# Patient Record
Sex: Female | Born: 1950 | Race: White | Hispanic: No | Smoking: Former smoker
Health system: Southern US, Community
[De-identification: ages and names within clinical notes are randomized; demographics above are authoritative.]

## PROBLEM LIST (undated history)

## (undated) DIAGNOSIS — K529 Noninfective gastroenteritis and colitis, unspecified: Secondary | ICD-10-CM

## (undated) DIAGNOSIS — T148XXA Other injury of unspecified body region, initial encounter: Secondary | ICD-10-CM

## (undated) DIAGNOSIS — B059 Measles without complication: Secondary | ICD-10-CM

## (undated) DIAGNOSIS — I10 Essential (primary) hypertension: Principal | ICD-10-CM

## (undated) DIAGNOSIS — B019 Varicella without complication: Secondary | ICD-10-CM

## (undated) DIAGNOSIS — D1620 Benign neoplasm of long bones of unspecified lower limb: Secondary | ICD-10-CM

## (undated) DIAGNOSIS — Z87891 Personal history of nicotine dependence: Secondary | ICD-10-CM

## (undated) DIAGNOSIS — M545 Low back pain, unspecified: Secondary | ICD-10-CM

## (undated) DIAGNOSIS — G8929 Other chronic pain: Secondary | ICD-10-CM

## (undated) DIAGNOSIS — Z8619 Personal history of other infectious and parasitic diseases: Secondary | ICD-10-CM

## (undated) DIAGNOSIS — R296 Repeated falls: Secondary | ICD-10-CM

## (undated) DIAGNOSIS — R109 Unspecified abdominal pain: Secondary | ICD-10-CM

## (undated) DIAGNOSIS — E7800 Pure hypercholesterolemia, unspecified: Secondary | ICD-10-CM

## (undated) DIAGNOSIS — R7989 Other specified abnormal findings of blood chemistry: Secondary | ICD-10-CM

## (undated) HISTORY — DX: Low back pain, unspecified: M54.50

## (undated) HISTORY — PX: OTHER SURGICAL HISTORY: SHX169

## (undated) HISTORY — DX: Morbid (severe) obesity due to excess calories: E66.01

## (undated) HISTORY — DX: Other chronic pain: G89.29

## (undated) HISTORY — PX: APPENDECTOMY: SHX54

## (undated) HISTORY — DX: Other injury of unspecified body region, initial encounter: T14.8XXA

## (undated) HISTORY — DX: Essential (primary) hypertension: I10

## (undated) HISTORY — PX: CATARACT EXTRACTION: SUR2

## (undated) HISTORY — DX: Unspecified abdominal pain: R10.9

## (undated) HISTORY — DX: Benign neoplasm of long bones of unspecified lower limb: D16.20

## (undated) HISTORY — DX: Personal history of other infectious and parasitic diseases: Z86.19

## (undated) HISTORY — DX: Measles without complication: B05.9

## (undated) HISTORY — DX: Personal history of nicotine dependence: Z87.891

## (undated) HISTORY — DX: Pure hypercholesterolemia, unspecified: E78.00

## (undated) HISTORY — DX: Other specified abnormal findings of blood chemistry: R79.89

## (undated) HISTORY — DX: Varicella without complication: B01.9

## (undated) HISTORY — DX: Noninfective gastroenteritis and colitis, unspecified: K52.9

## (undated) HISTORY — DX: Low back pain: M54.5

---

## 2017-06-05 HISTORY — PX: MOHS SURGERY: SUR867

## 2018-02-02 LAB — BASIC METABOLIC PANEL
BUN: 16 (ref 4–21)
Creatinine: 0.8 (ref 0.5–1.1)
Glucose: 102
Potassium: 3.8 (ref 3.4–5.3)
SODIUM: 141 (ref 137–147)

## 2018-02-02 LAB — CBC AND DIFFERENTIAL
HEMATOCRIT: 42 (ref 36–46)
HEMOGLOBIN: 13.9 (ref 12.0–16.0)
Platelets: 315 (ref 150–399)
WBC: 8.9

## 2018-02-02 LAB — LIPID PANEL
CHOLESTEROL: 186 (ref 0–200)
HDL: 35 (ref 35–70)
LDL Cholesterol: 124
Triglycerides: 152 (ref 40–160)

## 2018-02-02 LAB — VITAMIN D 25 HYDROXY (VIT D DEFICIENCY, FRACTURES): VIT D 25 HYDROXY: 43

## 2018-02-02 LAB — TSH: TSH: 1.23 (ref 0.41–5.90)

## 2018-02-02 LAB — HEPATIC FUNCTION PANEL
ALT: 13 (ref 7–35)
AST: 17 (ref 13–35)
Alkaline Phosphatase: 75 (ref 25–125)

## 2018-02-02 LAB — HM HEPATITIS C SCREENING LAB: HM Hepatitis Screen: NEGATIVE

## 2018-08-01 NOTE — Progress Notes (Signed)
Sonya Woods is a 66 y.o. female is here to establish care.  History of Present Illness:   HPI: See Assessment and Plan section for Problem Based Charting of issues discussed today.   Health Maintenance Due  Topic Date Due  . Hepatitis C Screening  02-11-1951  . MAMMOGRAM  05/24/2000  . COLONOSCOPY  05/24/2000  . DEXA SCAN  05/25/2015  . PNA vac Low Risk Adult (1 of 2 - PCV13) 05/25/2015   Depression screen PHQ 2/9 08/02/2018  Decreased Interest 0  Down, Depressed, Hopeless 0  PHQ - 2 Score 0   PMHx, SurgHx, SocialHx, FamHx, Medications, and Allergies were reviewed in the Visit Navigator and updated as appropriate.   Patient Active Problem List   Diagnosis Date Noted  . Essential hypertension 08/03/2018  . Pure hypercholesterolemia 08/03/2018  . Morbid obesity ([place]) 08/03/2018  . Mammogram declined 08/03/2018  . Chronic lower back pain    Social History   Tobacco Use  . Smoking status: Former Smoker    Types: Cigarettes    Last attempt to quit: 08/03/2011    Years since quitting: 7.0  . Smokeless tobacco: Never Used  Substance Use Topics  . Alcohol use: Never    Frequency: Never  . Drug use: Never   Current Medications and Allergies:   .  aspirin 81 MG chewable tablet, Chew by mouth daily., Disp: , Rfl:  .  B Complex-C (B-COMPLEX WITH VITAMIN C) tablet, Take 1 tablet by mouth daily., Disp: , Rfl:  .  cholecalciferol (VITAMIN D) 1000 units tablet, Take 2,000 Units by mouth daily., Disp: , Rfl:  .  Coenzyme Q10 (CO Q 10 PO), Take 200 mg by mouth., Disp: , Rfl:  .  hydrochlorothiazide (MICROZIDE) 12.5 MG capsule, Take 1 capsule (12.5 mg total) by mouth daily., Disp: 90 capsule, Rfl: 1 .  rosuvastatin (CRESTOR) 5 MG tablet, Take 1 tablet (5 mg total) by mouth daily., Disp: 90 tablet, Rfl: 1 .  amLODipine (NORVASC) 10 MG tablet, Take 1 tablet (10 mg total) by mouth daily., Disp: 90 tablet, Rfl: 1   Allergies  Allergen Reactions  . Tetanus Toxoids   . Tramadol  Other (See Comments)   Review of Systems   Pertinent items are noted in the HPI. Otherwise, ROS is negative.  Vitals:   Vitals:   08/02/18 1101  BP: 136/64  Pulse: 64  Temp: 98.1 F (36.7 C)  TempSrc: Oral  SpO2: 98%  Weight: 225 lb (102.1 kg)  Height: 5' 5.5" (1.664 m)     Body mass index is 36.87 kg/m.  Physical Exam:   General: Cooperative, alert and oriented, well developed, well nourished, in no acute distress. HEENT: EOMI. Conjunctivae and lids unremarkable, funduscopic exam and visual fields not performed. No pallor or cyanosis, dentition good. Neck: No thyromegaly. No JVD. No carotid bruits.  Cardiovascular: Regular rhythm. S1 normal. S2 normal. No S3 or S4. Apical impulse not displaced. No murmurs. No gallops. No rubs. Lungs: Clear bilaterally without rales, rhonchi, or wheezing.  Abdomen: Soft, nontender, no masses or hepatosplenomegaly. Extremities: No clubbing, cyanosis, erythema. No edema.  Pulses: 2+ radial, 2+ pedal pulses. Skin: Reveals no rashes.  Neurologic: Cranial nerves are intact with no focal deficits.  Psychiatric: Alert and oriented to person place and time.  Assessment and Plan:   Essential hypertension Controlled on current regimen. No side effects. Will continue and recheck in 4 months at physical.   Morbid obesity [facility]) The patient is asked to make an  attempt to improve diet and exercise patterns to aid in medical management of this problem.   Pure hypercholesterolemia Controlled on current regimen. No side effects. Will continue and recheck in 4 months at physical.   Mammogram declined We discussed the risks and benefits of mammograms. Discussed different recommendations for routine screening in low risk women. Pt refuses at this time.   Meds ordered this encounter  Medications  . hydrochlorothiazide (MICROZIDE) 12.5 MG capsule    Sig: Take 1 capsule (12.5 mg total) by mouth daily.    Dispense:  90 capsule    Refill:  1  .  rosuvastatin (CRESTOR) 5 MG tablet    Sig: Take 1 tablet (5 mg total) by mouth daily.    Dispense:  90 tablet    Refill:  1  . amLODipine (NORVASC) 10 MG tablet    Sig: Take 1 tablet (10 mg total) by mouth daily.    Dispense:  90 tablet    Refill:  1   . Reviewed expectations re: course of current medical issues. . Discussed self-management of symptoms. . Outlined signs and symptoms indicating need for more acute intervention. . Patient verbalized understanding and all questions were answered. Marland Kitchen Health Maintenance issues including appropriate healthy diet, exercise, and smoking avoidance were discussed with patient. . See orders for this visit as documented in the electronic medical record. . Patient received an After Visit Summary.  Briscoe Deutscher, DO [place] 08/03/2018

## 2018-08-02 ENCOUNTER — Ambulatory Visit (INDEPENDENT_AMBULATORY_CARE_PROVIDER_SITE_OTHER): Payer: Medicare Other | Admitting: Family Medicine

## 2018-08-02 ENCOUNTER — Encounter: Payer: Self-pay | Admitting: Family Medicine

## 2018-08-02 VITALS — BP 136/64 | HR 64 | Temp 98.1°F | Ht 65.5 in | Wt 225.0 lb

## 2018-08-02 DIAGNOSIS — Z5320 Procedure and treatment not carried out because of patient's decision for unspecified reasons: Secondary | ICD-10-CM

## 2018-08-02 DIAGNOSIS — E7800 Pure hypercholesterolemia, unspecified: Secondary | ICD-10-CM

## 2018-08-02 DIAGNOSIS — I10 Essential (primary) hypertension: Secondary | ICD-10-CM

## 2018-08-02 DIAGNOSIS — M545 Low back pain, unspecified: Secondary | ICD-10-CM

## 2018-08-02 DIAGNOSIS — G8929 Other chronic pain: Secondary | ICD-10-CM

## 2018-08-02 MED ORDER — ROSUVASTATIN CALCIUM 5 MG PO TABS: 5.0000 mg | ORAL_TABLET | Freq: Every day | ORAL | 1 refills | Status: DC

## 2018-08-02 MED ORDER — AMLODIPINE BESYLATE 10 MG PO TABS: 10.0000 mg | ORAL_TABLET | Freq: Every day | ORAL | 1 refills | Status: DC

## 2018-08-02 MED ORDER — HYDROCHLOROTHIAZIDE 12.5 MG PO CAPS: 12.5000 mg | ORAL_CAPSULE | Freq: Every day | ORAL | 1 refills | Status: DC

## 2018-08-03 ENCOUNTER — Encounter: Payer: Self-pay | Admitting: Family Medicine

## 2018-08-03 DIAGNOSIS — E782 Mixed hyperlipidemia: Secondary | ICD-10-CM | POA: Insufficient documentation

## 2018-08-03 DIAGNOSIS — E7800 Pure hypercholesterolemia, unspecified: Secondary | ICD-10-CM

## 2018-08-03 DIAGNOSIS — I10 Essential (primary) hypertension: Secondary | ICD-10-CM | POA: Insufficient documentation

## 2018-08-03 DIAGNOSIS — E7219 Other disorders of sulfur-bearing amino-acid metabolism: Secondary | ICD-10-CM

## 2018-08-03 DIAGNOSIS — Z5320 Procedure and treatment not carried out because of patient's decision for unspecified reasons: Secondary | ICD-10-CM | POA: Insufficient documentation

## 2018-08-03 DIAGNOSIS — R7983 Abnormal findings of blood amino-acid level: Secondary | ICD-10-CM

## 2018-08-03 DIAGNOSIS — D688 Other specified coagulation defects: Secondary | ICD-10-CM | POA: Insufficient documentation

## 2018-08-03 DIAGNOSIS — M545 Low back pain, unspecified: Secondary | ICD-10-CM | POA: Insufficient documentation

## 2018-08-03 DIAGNOSIS — R7303 Prediabetes: Secondary | ICD-10-CM

## 2018-08-03 DIAGNOSIS — E8881 Metabolic syndrome: Secondary | ICD-10-CM | POA: Insufficient documentation

## 2018-08-03 DIAGNOSIS — G8929 Other chronic pain: Secondary | ICD-10-CM | POA: Insufficient documentation

## 2018-08-03 DIAGNOSIS — E785 Hyperlipidemia, unspecified: Secondary | ICD-10-CM | POA: Insufficient documentation

## 2018-08-03 HISTORY — DX: Essential (primary) hypertension: I10

## 2018-08-03 HISTORY — DX: Prediabetes: R73.03

## 2018-08-03 HISTORY — DX: Pure hypercholesterolemia, unspecified: E78.00

## 2018-08-03 HISTORY — DX: Mixed hyperlipidemia: E78.2

## 2018-08-03 HISTORY — DX: Abnormal findings of blood amino-acid level: R79.83

## 2018-08-03 HISTORY — DX: Other specified coagulation defects: D68.8

## 2018-08-03 HISTORY — DX: Morbid (severe) obesity due to excess calories: E66.01

## 2018-08-03 NOTE — Assessment & Plan Note (Signed)
We discussed the risks and benefits of mammograms. Discussed different recommendations for routine screening in low risk women. Pt refuses at this time.

## 2018-08-03 NOTE — Assessment & Plan Note (Addendum)
Controlled on current regimen. No side effects. Will continue and recheck in 4 months at physical.

## 2018-08-03 NOTE — Assessment & Plan Note (Signed)
Controlled on current regimen. No side effects. Will continue and recheck in 4 months at physical.

## 2018-08-03 NOTE — Assessment & Plan Note (Signed)
The patient is asked to make an attempt to improve diet and exercise patterns to aid in medical management of this problem.  

## 2019-01-12 ENCOUNTER — Telehealth: Payer: Self-pay

## 2019-01-12 NOTE — Telephone Encounter (Signed)
Called patient to make AWV app. She has flip phone and old computer with not webcam or speakers. We have made note of that and will call after everything clears up. She does need f/u to get meds have checked and insurance will do telephone visit. Do you want me to make phone visit for med f/u she will need next week.

## 2019-01-14 NOTE — Telephone Encounter (Signed)
Okay telephone visit for follow up.

## 2019-01-16 ENCOUNTER — Ambulatory Visit (INDEPENDENT_AMBULATORY_CARE_PROVIDER_SITE_OTHER): Payer: Medicare Other | Admitting: Family Medicine

## 2019-01-16 ENCOUNTER — Encounter: Payer: Self-pay | Admitting: Family Medicine

## 2019-01-16 ENCOUNTER — Other Ambulatory Visit: Payer: Self-pay

## 2019-01-16 VITALS — Ht 65.5 in | Wt 225.0 lb

## 2019-01-16 DIAGNOSIS — Z5320 Procedure and treatment not carried out because of patient's decision for unspecified reasons: Secondary | ICD-10-CM

## 2019-01-16 DIAGNOSIS — R7983 Abnormal findings of blood amino-acid level: Secondary | ICD-10-CM

## 2019-01-16 DIAGNOSIS — Z9189 Other specified personal risk factors, not elsewhere classified: Secondary | ICD-10-CM

## 2019-01-16 DIAGNOSIS — E7800 Pure hypercholesterolemia, unspecified: Secondary | ICD-10-CM

## 2019-01-16 DIAGNOSIS — I10 Essential (primary) hypertension: Secondary | ICD-10-CM

## 2019-01-16 DIAGNOSIS — E7219 Other disorders of sulfur-bearing amino-acid metabolism: Secondary | ICD-10-CM

## 2019-01-16 DIAGNOSIS — E8881 Metabolic syndrome: Secondary | ICD-10-CM

## 2019-01-16 HISTORY — DX: Other specified personal risk factors, not elsewhere classified: Z91.89

## 2019-01-16 MED ORDER — ROSUVASTATIN CALCIUM 5 MG PO TABS: 5.0000 mg | ORAL_TABLET | Freq: Every day | ORAL | 1 refills | Status: DC

## 2019-01-16 MED ORDER — AMLODIPINE BESYLATE 10 MG PO TABS: 10.0000 mg | ORAL_TABLET | Freq: Every day | ORAL | 1 refills | Status: DC

## 2019-01-16 NOTE — Progress Notes (Signed)
Virtual Visit via Video   I connected with Sonya Woods on 01/16/19 at 10:40 AM EDT by a video enabled telemedicine application and verified that I am speaking with the correct person using two identifiers. Location patient: Home Location provider: [facility], Office Persons participating in the virtual visit: Sonya Woods, [place], DO Sonya Woods, CMA acting as scribe for Dr. Briscoe Woods.   I discussed the limitations of evaluation and management by telemedicine and the availability of in person appointments. The patient expressed understanding and agreed to proceed.  Subjective:   HPI: Patient needs refill on the Norvasc and hydrochlorothiazide. She has not had any issues or side effects from medications. She has not been able to check her blood pressure. She will pick up a home cuff soon and call with her readings.   Is the patient taking medications without problems? Yes. Does the patient complain of muscle aches?  No. Trying to exercise on a regular basis? Yes. Compliant with diet? Yes.  Lab Results  Component Value Date   CHOL 186 02/02/2018   HDL 35 02/02/2018   LDLCALC 124 02/02/2018   TRIG 152 02/02/2018   Lab Results  Component Value Date   ALT 13 02/02/2018   AST 17 02/02/2018   ALKPHOS 75 02/02/2018     Reviewed all precautions and expectations with prevention of Covid-19. She works from home.   ROS: See pertinent positives and negatives per HPI.  Patient Active Problem List   Diagnosis Date Noted  . Cardiovascular risk factor > 12% 01/16/2019  . Essential hypertension 08/03/2018  . Pure hypercholesterolemia 08/03/2018  . Morbid obesity ([place]) 08/03/2018  . Mammogram declined 08/03/2018  . Metabolic syndrome 56/43/3295  . 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 Chronic lower back pain     Social History   Tobacco Use  . Smoking status: Former Smoker    Types: Cigarettes    Last attempt to quit:  08/03/2011    Years since quitting: 7.4  . Smokeless tobacco: Never Used  Substance Use Topics  . Alcohol use: Never    Frequency: Never   Current Outpatient Medications:  .  amLODipine (NORVASC) 10 MG tablet, Take 1 tablet (10 mg total) by mouth daily., Disp: 90 tablet, Rfl: 1 .  aspirin 81 MG chewable tablet, Chew by mouth daily., Disp: , Rfl:  .  B Complex-C (B-COMPLEX WITH VITAMIN C) tablet, Take 1 tablet by mouth daily., Disp: , Rfl:  .  cholecalciferol (VITAMIN D) 1000 units tablet, Take 2,000 Units by mouth daily., Disp: , Rfl:  .  Coenzyme Q10 (CO Q 10 PO), Take 200 mg by mouth., Disp: , Rfl:  .  hydrochlorothiazide (MICROZIDE) 12.5 MG capsule, Take 1 capsule (12.5 mg total) by mouth daily., Disp: 90 capsule, Rfl: 1 .  rosuvastatin (CRESTOR) 5 MG tablet, Take 1 tablet (5 mg total) by mouth daily., Disp: 90 tablet, Rfl: 1  Allergies  Allergen Reactions  . Losartan Potassium-Hctz   . Tetanus Toxoids   . Tramadol Other (See Comments)    Objective:   VITALS: Per patient if applicable, see vitals. GENERAL: Alert, appears well and in no acute distress. HEENT: Atraumatic, conjunctiva clear, no obvious abnormalities on inspection of external nose and ears. NECK: Normal movements of the head and neck. CARDIOPULMONARY: No increased WOB. Speaking in clear sentences. I:E ratio WNL.  MS: Moves all visible extremities without noticeable abnormality. PSYCH: Pleasant and cooperative, well-groomed. Speech normal rate and rhythm. Affect is  appropriate. Insight and judgement are appropriate. Attention is focused, linear, and appropriate.  NEURO: CN grossly intact. Oriented as arrived to appointment on time with no prompting. Moves both UE equally.  SKIN: No obvious lesions, wounds, erythema, or cyanosis noted on face or hands.  Assessment and Plan:   Sonya Woods was seen today for medication refill.  Diagnoses and all orders for this visit:  Pure hypercholesterolemia Comments: Check labs at  next visit. May increase Crestor to 10. Orders: -     rosuvastatin (CRESTOR) 5 MG tablet; Take 1 tablet (5 mg total) by mouth daily.  Metabolic syndrome  Morbid obesity ([place]) Comments: Discussed diet and exercise.   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  Mammogram declined  Essential hypertension Comments: Controlled. Continue current treatment. Orders: -     amLODipine (NORVASC) 10 MG tablet; Take 1 tablet (10 mg total) by mouth daily.   . Reviewed expectations re: course of current medical issues. . Discussed self-management of symptoms. . Outlined signs and symptoms indicating need for more acute intervention. . Patient verbalized understanding and all questions were answered. Marland Kitchen Health Maintenance issues including appropriate healthy diet, exercise, and smoking avoidance were discussed with patient. . See orders for this visit as documented in the electronic medical record.  Sonya Deutscher, DO 01/16/2019

## 2019-01-17 ENCOUNTER — Other Ambulatory Visit: Payer: Self-pay | Admitting: Family Medicine

## 2019-01-17 MED ORDER — HYDROCHLOROTHIAZIDE 12.5 MG PO CAPS: 12.5000 mg | ORAL_CAPSULE | Freq: Every day | ORAL | 1 refills | Status: DC

## 2019-01-17 NOTE — Telephone Encounter (Signed)
Requested Prescriptions  Pending Prescriptions Disp Refills  . hydrochlorothiazide (MICROZIDE) 12.5 MG capsule 90 capsule 1    Sig: Take 1 capsule (12.5 mg total) by mouth daily.     Cardiovascular: Diuretics - Thiazide Failed - 01/17/2019  9:46 AM      Failed - Ca in normal range and within 360 days    No results found for: CALCIUM, CORRECTEDCA, CAWHOLEBLD, POCCA       Passed - Cr in normal range and within 360 days    Creatinine  Date Value Ref Range Status  02/02/2018 0.8 0.5 - 1.1 Final         Passed - K in normal range and within 360 days    Potassium  Date Value Ref Range Status  02/02/2018 3.8 3.4 - 5.3 Final         Passed - Na in normal range and within 360 days    Sodium  Date Value Ref Range Status  02/02/2018 141 137 - 147 Final         Passed - Last BP in normal range    BP Readings from Last 1 Encounters:  08/02/18 136/64         Passed - Valid encounter within last 6 months    Recent Outpatient Visits          Yesterday Pure hypercholesterolemia   [place] [facility], [place], [place]   5 months ago Essential hypertension   [place], [place], [place]

## 2019-07-19 ENCOUNTER — Other Ambulatory Visit: Payer: Self-pay

## 2019-07-19 DIAGNOSIS — I10 Essential (primary) hypertension: Secondary | ICD-10-CM

## 2019-07-19 DIAGNOSIS — E7800 Pure hypercholesterolemia, unspecified: Secondary | ICD-10-CM

## 2019-07-19 MED ORDER — AMLODIPINE BESYLATE 10 MG PO TABS: 10.0000 mg | ORAL_TABLET | Freq: Every day | ORAL | 1 refills | Status: DC

## 2019-07-19 MED ORDER — HYDROCHLOROTHIAZIDE 12.5 MG PO CAPS: 12.5000 mg | ORAL_CAPSULE | Freq: Every day | ORAL | 1 refills | Status: DC

## 2019-07-19 MED ORDER — ROSUVASTATIN CALCIUM 5 MG PO TABS: 5.0000 mg | ORAL_TABLET | Freq: Every day | ORAL | 1 refills | Status: DC

## 2019-07-28 ENCOUNTER — Other Ambulatory Visit: Payer: Self-pay

## 2019-07-28 DIAGNOSIS — E7800 Pure hypercholesterolemia, unspecified: Secondary | ICD-10-CM

## 2019-07-28 DIAGNOSIS — I10 Essential (primary) hypertension: Secondary | ICD-10-CM

## 2019-07-28 MED ORDER — ROSUVASTATIN CALCIUM 5 MG PO TABS: 5.0000 mg | ORAL_TABLET | Freq: Every day | ORAL | 1 refills | Status: DC

## 2019-07-28 MED ORDER — HYDROCHLOROTHIAZIDE 12.5 MG PO CAPS: 12.5000 mg | ORAL_CAPSULE | Freq: Every day | ORAL | 1 refills | Status: DC

## 2019-07-28 MED ORDER — AMLODIPINE BESYLATE 10 MG PO TABS: 10.0000 mg | ORAL_TABLET | Freq: Every day | ORAL | 1 refills | Status: DC

## 2019-09-07 ENCOUNTER — Other Ambulatory Visit: Payer: Self-pay | Admitting: Family Medicine

## 2019-09-07 DIAGNOSIS — Z1231 Encounter for screening mammogram for malignant neoplasm of breast: Secondary | ICD-10-CM

## 2020-02-08 ENCOUNTER — Other Ambulatory Visit: Payer: Self-pay

## 2020-02-08 ENCOUNTER — Encounter: Payer: Self-pay | Admitting: Family Medicine

## 2020-02-08 ENCOUNTER — Ambulatory Visit (INDEPENDENT_AMBULATORY_CARE_PROVIDER_SITE_OTHER): Payer: Medicare Other | Admitting: Family Medicine

## 2020-02-08 VITALS — BP 160/80 | HR 77 | Temp 98.3°F | Ht 65.5 in | Wt 225.0 lb

## 2020-02-08 DIAGNOSIS — E8881 Metabolic syndrome: Secondary | ICD-10-CM

## 2020-02-08 DIAGNOSIS — I10 Essential (primary) hypertension: Secondary | ICD-10-CM | POA: Diagnosis not present

## 2020-02-08 DIAGNOSIS — E7800 Pure hypercholesterolemia, unspecified: Secondary | ICD-10-CM

## 2020-02-08 DIAGNOSIS — Z23 Encounter for immunization: Secondary | ICD-10-CM | POA: Diagnosis not present

## 2020-02-08 LAB — HEMOGLOBIN A1C: Hgb A1c MFr Bld: 5.7 % (ref 4.6–6.5)

## 2020-02-08 MED ORDER — HYDROCHLOROTHIAZIDE 25 MG PO TABS: 25.0000 mg | ORAL_TABLET | Freq: Every day | ORAL | 3 refills | Status: DC

## 2020-02-08 NOTE — Patient Instructions (Addendum)
pneumonia shot today. Will not need again. One and done.   If you want shingles vaccine, go to pharmacy. Much cheaper.   Increasing your hctz to 25mg /day. I sent in new px. Bring your cuff on next visit so we can make sure it's calibrated correctly.   i'll see you in one month!  So nice to meet you!   Dr. Rogers Blocker

## 2020-02-08 NOTE — Progress Notes (Signed)
Patient: Sonya Woods MRN: [id number] DOB: 04/06/1951 PCP: Orma Flaming, MD     Subjective:  Chief Complaint  Patient presents with  . Hypertension  . Hyperlipidemia  . metabolic syndrome  . Form Completion    Handicap parking    HPI: The patient is a 69 y.o. female who presents today for transfer of care. She has a past medical history significant for HTN, hyperlipidemia, obesity, metabolic syndrome, homocysteinemi and hyperfibrinogenemia.   Hypertension  Here for follow up of hypertension.  Currently on norvasc 10mg , hctz 12.5mg  . Home readings range from [id number] diastolic. Takes medication as prescribed and denies any side effects. Exercise includes sit ups, limited due to back. Weight has been stable. Denies any chest pain, headaches, shortness of breath, vision changes, swelling in lower extremities.    Hyperlipidemia Currently on crestor 5mg . +Fh of heart disease in her father. Died of MI at age 34 years of age. Former smoker, stopped in 2012. She has no hx of diabetes.   metabolic syndrome Weight stable Tries to exercise, but limited with her back pain.   Would like me to renew her handicap placard.    HM -declines colonoscopy and mmg.  -will get her pneumovax today -interested in shingles shot -has had her covid shots    Review of Systems  HENT: Negative for congestion and sore throat.   Respiratory: Negative for choking, shortness of breath and wheezing.   Cardiovascular: Negative for chest pain and palpitations.  Gastrointestinal: Negative for abdominal pain, nausea and vomiting.  Musculoskeletal: Positive for back pain (chronic ).  Neurological: Negative for dizziness, light-headedness and headaches.    Allergies Patient is allergic to losartan potassium-hctz; tetanus toxoids; and tramadol.  Past Medical History Patient  has a past medical history of Benign tumor of right femur, s/p removal, Chicken pox, Chronic lower back pain,  Essential hypertension (08/03/2018), Former smoker, 100 pack year Hx, quit < 10 years ago, Fracture, Low vitamin D level, Measles, Morbid obesity ([place]) (08/03/2018), and Pure hypercholesterolemia (08/03/2018).  Surgical History Patient  has a past surgical history that includes Cesarean section (1976); Appendectomy; Mohs surgery (06/2017); Cataract extraction (Right); and S/P Bone Tumor Surgery .  Family History Pateint's family history includes Dementia in her mother; Heart attack in her father; Hyperlipidemia in her father; Hypertension in her father; Mental illness in her mother.  Social History Patient  reports that she quit smoking about 8 years ago. Her smoking use included cigarettes. She has never used smokeless tobacco. She reports that she does not drink alcohol or use drugs.    Objective: Vitals:   02/08/20 1306 02/08/20 1340  BP: (!) 162/70 (!) 160/80  Pulse: 77   Temp: 98.3 F (36.8 C)   TempSrc: Temporal   SpO2: 98%   Weight: 225 lb (102.1 kg)   Height: 5' 5.5" (1.664 m)     Body mass index is 36.87 kg/m.  Physical Exam Vitals reviewed.  Constitutional:      Appearance: Normal appearance. She is well-developed. She is obese.  HENT:     Head: Normocephalic and atraumatic.     Right Ear: External ear normal.     Left Ear: External ear normal.  Eyes:     Conjunctiva/sclera: Conjunctivae normal.     Pupils: Pupils are equal, round, and reactive to light.  Neck:     Thyroid: No thyromegaly.     Vascular: No carotid bruit.  Cardiovascular:     Rate and Rhythm: Normal rate and regular rhythm.  Heart sounds: Normal heart sounds. No murmur.  Pulmonary:     Effort: Pulmonary effort is normal.     Breath sounds: Normal breath sounds.  Abdominal:     General: Bowel sounds are normal. There is no distension.     Palpations: Abdomen is soft.     Tenderness: There is no abdominal tenderness.  Musculoskeletal:     Cervical back: Normal range of motion and neck  supple.     Right lower leg: No edema.     Left lower leg: Edema present.  Lymphadenopathy:     Cervical: No cervical adenopathy.  Skin:    General: Skin is warm and dry.     Capillary Refill: Capillary refill takes less than 2 seconds.     Findings: No rash.  Neurological:     General: No focal deficit present.     Mental Status: She is alert and oriented to person, place, and time.     Cranial Nerves: No cranial nerve deficit.     Coordination: Coordination normal.     Deep Tendon Reflexes: Reflexes normal.  Psychiatric:        Mood and Affect: Mood normal.        Behavior: Behavior normal.          Office Visit from 02/08/2020 in [place]  PHQ-2 Total Score  0      Assessment/plan: 1. Essential hypertension Blood pressure is not to goal. Continue norvasc at 10mg  and we are going to increase her hctz to 25mg . New px sent in. She will f/u in 1 month with log and blood pressure cuff to make sure calibrated correctly.   Refills given and routine lab work will be done today. Recommended routine exercise and healthy diet including DASH diet and mediterranean diet. Encouraged weight loss. F/u in 1 months.    2. Pure hypercholesterolemia Lipid panel. May have to do on next visit. Do not think epic was working and lab was not seen. Continue crestor.   3. Metabolic syndrome  - Hemoglobin A1c  4. Need for vaccination for Strep pneumoniae  - Pneumococcal polysaccharide vaccine 23-valent greater than or equal to 2yo subcutaneous/IM  -handicap placard filled out.  -recommended shingles vaccine at pharmacy due to cheaper cost.   This visit occurred during the SARS-CoV-2 public health emergency.  Safety protocols were in place, including screening questions prior to the visit, additional usage of staff PPE, and extensive cleaning of exam room while observing appropriate contact time as indicated for disinfecting solutions.     Return in about 1 month  (around 03/10/2020) for blood pressure follow up .     Orma Flaming, MD [place]  02/08/2020

## 2020-02-12 ENCOUNTER — Encounter: Payer: Self-pay | Admitting: Family Medicine

## 2020-03-11 ENCOUNTER — Other Ambulatory Visit: Payer: Self-pay

## 2020-03-11 ENCOUNTER — Encounter: Payer: Self-pay | Admitting: Family Medicine

## 2020-03-11 ENCOUNTER — Ambulatory Visit (INDEPENDENT_AMBULATORY_CARE_PROVIDER_SITE_OTHER): Payer: Medicare Other | Admitting: Family Medicine

## 2020-03-11 DIAGNOSIS — I10 Essential (primary) hypertension: Secondary | ICD-10-CM

## 2020-03-11 DIAGNOSIS — E7800 Pure hypercholesterolemia, unspecified: Secondary | ICD-10-CM

## 2020-03-11 LAB — CBC WITH DIFFERENTIAL/PLATELET
Basophils Absolute: 0.1 10*3/uL (ref 0.0–0.1)
Basophils Relative: 0.7 % (ref 0.0–3.0)
Eosinophils Absolute: 0.3 10*3/uL (ref 0.0–0.7)
Eosinophils Relative: 3.1 % (ref 0.0–5.0)
HCT: 40.7 % (ref 36.0–46.0)
Hemoglobin: 13.5 g/dL (ref 12.0–15.0)
Lymphocytes Relative: 22.8 % (ref 12.0–46.0)
Lymphs Abs: 2.4 10*3/uL (ref 0.7–4.0)
MCHC: 33.1 g/dL (ref 30.0–36.0)
MCV: 85.3 fl (ref 78.0–100.0)
Monocytes Absolute: 0.8 10*3/uL (ref 0.1–1.0)
Monocytes Relative: 7.2 % (ref 3.0–12.0)
Neutro Abs: 7.1 10*3/uL (ref 1.4–7.7)
Neutrophils Relative %: 66.2 % (ref 43.0–77.0)
Platelets: 266 10*3/uL (ref 150.0–400.0)
RBC: 4.77 Mil/uL (ref 3.87–5.11)
RDW: 14.9 % (ref 11.5–15.5)
WBC: 10.7 10*3/uL — ABNORMAL HIGH (ref 4.0–10.5)

## 2020-03-11 LAB — COMPREHENSIVE METABOLIC PANEL
ALT: 12 U/L (ref 0–35)
AST: 17 U/L (ref 0–37)
Albumin: 4.2 g/dL (ref 3.5–5.2)
Alkaline Phosphatase: 58 U/L (ref 39–117)
BUN: 15 mg/dL (ref 6–23)
CO2: 32 mEq/L (ref 19–32)
Calcium: 9.6 mg/dL (ref 8.4–10.5)
Chloride: 101 mEq/L (ref 96–112)
Creatinine, Ser: 0.87 mg/dL (ref 0.40–1.20)
GFR: 64.41 mL/min (ref >60.00)
Glucose, Bld: 99 mg/dL (ref 70–99)
Potassium: 3.7 mEq/L (ref 3.5–5.1)
Sodium: 140 mEq/L (ref 135–145)
Total Bilirubin: 0.5 mg/dL (ref 0.2–1.2)
Total Protein: 7.2 g/dL (ref 6.0–8.3)

## 2020-03-11 LAB — LIPID PANEL
Cholesterol: 131 mg/dL (ref 0–200)
HDL: 40.5 mg/dL (ref >39.00)
LDL Cholesterol: 65 mg/dL (ref 0–99)
NonHDL: 90.87
Total CHOL/HDL Ratio: 3
Triglycerides: 127 mg/dL (ref 0.0–149.0)
VLDL: 25.4 mg/dL (ref 0.0–40.0)

## 2020-03-11 NOTE — Progress Notes (Signed)
Patient: Sonya Woods MRN: [id number] DOB: [birth year] PCP: Orma Flaming, MD     Subjective:  Chief Complaint  Patient presents with  . Hypertension    HPI: The patient is a 69 y.o. female who presents today for Hypertension follow up. She is also here for fasting labs.   Hypertension: Here for follow up of hypertension.  I increased her hctz to 25mg .  Currently on norvasc 10mg  and hctz 25mg  . Home readings range from 846 [id number]/17-79 diastolic. Takes medication as prescribed and denies any side effects. Exercise includes none. Weight has been stable. Denies any chest pain, headaches, shortness of breath, vision changes, swelling in lower extremities.    Review of Systems  Constitutional: Negative for chills, fatigue and fever.  Respiratory: Negative for shortness of breath and wheezing.   Cardiovascular: Negative for chest pain and palpitations.  Gastrointestinal: Negative for abdominal pain, nausea and vomiting.  Neurological: Negative for dizziness, light-headedness and headaches.    Allergies Patient is allergic to losartan potassium-hctz; tetanus toxoids; and tramadol.  Past Medical History Patient  has a past medical history of Benign tumor of right femur, s/p removal, Chicken pox, Chronic lower back pain, Essential hypertension (08/03/2018), Former smoker, 100 pack year Hx, quit < 10 years ago, Fracture, Low vitamin D level, Measles, Morbid obesity ([place]) (08/03/2018), and Pure hypercholesterolemia (08/03/2018).  Surgical History Patient  has a past surgical history that includes Cesarean section (1976); Appendectomy; Mohs surgery (06/2017); Cataract extraction (Right); and S/P Bone Tumor Surgery .  Family History Pateint's family history includes Dementia in her mother; Heart attack in her father; Hyperlipidemia in her father; Hypertension in her father; Mental illness in her mother.  Social History Patient  reports that she quit smoking about 8 years ago. Her  smoking use included cigarettes. She has never used smokeless tobacco. She reports that she does not drink alcohol or use drugs.    Objective: Vitals:   03/11/20 0851  BP: 134/74  Pulse: 66  Temp: (!) 97.2 F (36.2 C)  TempSrc: Temporal  SpO2: 98%  Height: 5' 5.5" (1.664 m)    Body mass index is 36.87 kg/m.  Physical Exam Vitals reviewed.  Constitutional:      Appearance: Normal appearance. She is obese.  HENT:     Head: Normocephalic and atraumatic.  Cardiovascular:     Rate and Rhythm: Normal rate and regular rhythm.     Heart sounds: Murmur present.  Pulmonary:     Effort: Pulmonary effort is normal.     Breath sounds: Normal breath sounds.  Abdominal:     General: Bowel sounds are normal.     Palpations: Abdomen is soft.  Musculoskeletal:     Cervical back: Normal range of motion and neck supple.  Neurological:     Mental Status: She is alert.  Psychiatric:        Mood and Affect: Mood normal.        Behavior: Behavior normal.        Assessment/plan: 1. Essential hypertension Blood pressure is to goal. Continue current anti-hypertensive medications per hpi. Tolerating increased hctz. Refills not given and routine lab work will be done today. Recommended routine exercise and healthy diet including DASH diet and mediterranean diet. Encouraged weight loss. F/u in 6 months.   - Microalbumin / creatinine urine ratio - Comprehensive metabolic panel - CBC with Differential/Platelet  2. Pure hypercholesterolemia  - Lipid panel  Fasting labs, declines ekg today   3, new murmur -sounds like a flow murmur.  She is asymptomatic. Will f/u in 6 months. Precautions given.   This visit occurred during the SARS-CoV-2 public health emergency.  Safety protocols were in place, including screening questions prior to the visit, additional usage of staff PPE, and extensive cleaning of exam room while observing appropriate contact time as indicated for disinfecting solutions.     Return in about 6 months (around 09/10/2020) for routine bp f/u and ekg .   Orma Flaming, MD [place]   03/11/2020

## 2020-04-04 ENCOUNTER — Ambulatory Visit: Payer: Medicare Other

## 2020-05-06 ENCOUNTER — Ambulatory Visit: Payer: Medicare Other

## 2020-07-03 ENCOUNTER — Other Ambulatory Visit: Payer: Self-pay | Admitting: Family Medicine

## 2020-07-05 ENCOUNTER — Other Ambulatory Visit: Payer: Self-pay | Admitting: Family Medicine

## 2020-07-05 DIAGNOSIS — I10 Essential (primary) hypertension: Secondary | ICD-10-CM

## 2020-07-09 ENCOUNTER — Telehealth: Payer: Self-pay

## 2020-07-09 DIAGNOSIS — E7800 Pure hypercholesterolemia, unspecified: Secondary | ICD-10-CM

## 2020-07-09 DIAGNOSIS — I10 Essential (primary) hypertension: Secondary | ICD-10-CM

## 2020-07-09 MED ORDER — ROSUVASTATIN CALCIUM 5 MG PO TABS: 5.0000 mg | ORAL_TABLET | Freq: Every day | ORAL | 1 refills | Status: DC

## 2020-07-09 MED ORDER — AMLODIPINE BESYLATE 10 MG PO TABS: 10.0000 mg | ORAL_TABLET | Freq: Every day | ORAL | 1 refills | Status: DC

## 2020-07-09 NOTE — Telephone Encounter (Signed)
Medication sent in. 

## 2020-07-09 NOTE — Telephone Encounter (Signed)
.. °  LAST APPOINTMENT DATE: 07/05/2020   NEXT APPOINTMENT DATE:@12 /05/2020  MEDICATION:amLODipine (NORVASC) 10 MG tablet rosuvastatin (CRESTOR) 5 MG tablet      Please advise

## 2020-09-09 ENCOUNTER — Telehealth: Payer: Self-pay

## 2020-09-09 NOTE — Telephone Encounter (Signed)
Patient states that since we doubled her hydrochlorothiazide (HYDRODIURIL) 25 MG tablet used to be 12 MG patient is expeiricng hair loss and dry skin all over body and expresses she cannot deal with it anymore and would like it either reduced or try a different medication.

## 2020-09-09 NOTE — Telephone Encounter (Signed)
Please have her cut her dose back down to 1/2 a pill a day. That is odd complaints as you dont' see hair loss typically in lower doses. If she continues to have hair loss unsure if medication. Also what is her allergy to losartan?   - call me in 2 weeks with blood pressure readings after decreasing pill. Thanks,  Dr. Rogers Blocker

## 2020-09-11 ENCOUNTER — Ambulatory Visit: Payer: Medicare Other | Admitting: Family Medicine

## 2020-09-11 NOTE — Telephone Encounter (Signed)
I gave pt message below, states when she took lower dose she had no problems, when she started double dose, she started to have skin reactions. She is unsure allergy to losartan. She agrees to follow up in 2 weeks with BP readings.

## 2020-10-09 ENCOUNTER — Telehealth: Payer: Self-pay | Admitting: Family Medicine

## 2020-10-09 NOTE — Telephone Encounter (Signed)
Left message for patient to call back and schedule Medicare Annual Wellness Visit (AWV) either virtually OR in office.   No hx; please schedule at anytime with [facility]-Nurse Health Advisor at [facility].  This should be a 45 minute visit.   

## 2021-01-04 ENCOUNTER — Other Ambulatory Visit: Payer: Self-pay | Admitting: Family Medicine

## 2021-01-04 DIAGNOSIS — E7800 Pure hypercholesterolemia, unspecified: Secondary | ICD-10-CM

## 2021-01-07 ENCOUNTER — Other Ambulatory Visit: Payer: Self-pay

## 2021-01-07 ENCOUNTER — Other Ambulatory Visit: Payer: Self-pay | Admitting: Family Medicine

## 2021-01-07 DIAGNOSIS — I10 Essential (primary) hypertension: Secondary | ICD-10-CM

## 2021-01-07 MED ORDER — AMLODIPINE BESYLATE 10 MG PO TABS: 10.0000 mg | ORAL_TABLET | Freq: Every day | ORAL | 0 refills | Status: DC

## 2021-01-27 ENCOUNTER — Encounter: Payer: Self-pay | Admitting: Podiatry

## 2021-01-27 ENCOUNTER — Other Ambulatory Visit: Payer: Self-pay

## 2021-01-27 ENCOUNTER — Ambulatory Visit: Payer: Medicare Other | Admitting: Podiatry

## 2021-01-27 DIAGNOSIS — B351 Tinea unguium: Secondary | ICD-10-CM

## 2021-01-27 DIAGNOSIS — M79674 Pain in right toe(s): Secondary | ICD-10-CM | POA: Insufficient documentation

## 2021-01-27 DIAGNOSIS — M79675 Pain in left toe(s): Secondary | ICD-10-CM

## 2021-01-27 HISTORY — DX: Tinea unguium: M79.674

## 2021-01-27 NOTE — Progress Notes (Signed)
This patient rpresents  to the office for evaluation and treatment of long thick painful nails .  This patient is unable to trim her own nails since the patient cannot reach her feet.  Patient says the nails are painful walking and wearing his shoes.  He returns for preventive foot care services.  General Appearance  Alert, conversant and in no acute stress.  Vascular  Dorsalis pedis and posterior tibial  pulses are palpable  bilaterally.  Capillary return is within normal limits  bilaterally. Temperature is within normal limits  bilaterally.  Neurologic  Senn-Weinstein monofilament wire test within normal limits  bilaterally. Muscle power within normal limits bilaterally.  Nails Thick disfigured discolored nails with subungual debris  from hallux to fifth toes bilaterally. No evidence of bacterial infection or drainage bilaterally.  Orthopedic  No limitations of motion  feet .  No crepitus or effusions noted.  No bony pathology or digital deformities noted.  Skin  normotropic skin with no porokeratosis noted bilaterally.  No signs of infections or ulcers noted.     Onychomycosis  Pain in toes right foot  Pain in toes left foot  Debridement  of nails  1-5  B/L with a nail nipper.  Nails were then filed using a dremel tool with no incidents.    RTC  3 months   Gardiner Barefoot DPM

## 2021-02-08 ENCOUNTER — Other Ambulatory Visit: Payer: Self-pay | Admitting: Family Medicine

## 2021-02-08 DIAGNOSIS — I10 Essential (primary) hypertension: Secondary | ICD-10-CM

## 2021-02-20 ENCOUNTER — Other Ambulatory Visit: Payer: Self-pay | Admitting: Family Medicine

## 2021-04-28 ENCOUNTER — Ambulatory Visit: Payer: Medicare Other | Admitting: Podiatry

## 2021-05-14 ENCOUNTER — Ambulatory Visit: Payer: Medicare Other | Admitting: Podiatry

## 2021-07-01 ENCOUNTER — Other Ambulatory Visit: Payer: Self-pay | Admitting: Family Medicine

## 2021-07-01 DIAGNOSIS — E7800 Pure hypercholesterolemia, unspecified: Secondary | ICD-10-CM

## 2021-07-07 ENCOUNTER — Other Ambulatory Visit: Payer: Self-pay | Admitting: Family Medicine

## 2021-07-07 ENCOUNTER — Telehealth: Payer: Self-pay

## 2021-07-07 DIAGNOSIS — E7800 Pure hypercholesterolemia, unspecified: Secondary | ICD-10-CM

## 2021-07-07 MED ORDER — ROSUVASTATIN CALCIUM 5 MG PO TABS: ORAL_TABLET | ORAL | 1 refills | Status: DC

## 2021-07-07 NOTE — Telephone Encounter (Signed)
Refill sent to pharmacy.   

## 2021-07-07 NOTE — Telephone Encounter (Signed)
LAST APPOINTMENT DATE:  03/11/20  NEXT APPOINTMENT DATE: 08/01/21   TOC with Colletta Maryland from [place]: rosuvastatin (CRESTOR) 5 MG tablet  PHARMACY: [place], [place] - [address] AT [place]

## 2021-07-24 ENCOUNTER — Observation Stay (HOSPITAL_COMMUNITY)
Admission: EM | Admit: 2021-07-24 | Discharge: 2021-07-27 | Disposition: A | Discharge status: Home / Self Care | Payer: Medicare Other | Attending: Internal Medicine | Admitting: Internal Medicine

## 2021-07-24 ENCOUNTER — Emergency Department (HOSPITAL_COMMUNITY): Payer: Medicare Other

## 2021-07-24 ENCOUNTER — Encounter (HOSPITAL_COMMUNITY): Payer: Self-pay

## 2021-07-24 ENCOUNTER — Other Ambulatory Visit: Payer: Self-pay

## 2021-07-24 ENCOUNTER — Telehealth: Payer: Self-pay

## 2021-07-24 DIAGNOSIS — R1030 Lower abdominal pain, unspecified: Secondary | ICD-10-CM | POA: Diagnosis not present

## 2021-07-24 DIAGNOSIS — E876 Hypokalemia: Secondary | ICD-10-CM | POA: Diagnosis not present

## 2021-07-24 DIAGNOSIS — K529 Noninfective gastroenteritis and colitis, unspecified: Secondary | ICD-10-CM

## 2021-07-24 DIAGNOSIS — Z79899 Other long term (current) drug therapy: Secondary | ICD-10-CM | POA: Diagnosis not present

## 2021-07-24 DIAGNOSIS — I771 Stricture of artery: Secondary | ICD-10-CM

## 2021-07-24 DIAGNOSIS — E782 Mixed hyperlipidemia: Secondary | ICD-10-CM | POA: Diagnosis present

## 2021-07-24 DIAGNOSIS — K5730 Diverticulosis of large intestine without perforation or abscess without bleeding: Secondary | ICD-10-CM | POA: Diagnosis not present

## 2021-07-24 DIAGNOSIS — K551 Chronic vascular disorders of intestine: Secondary | ICD-10-CM

## 2021-07-24 DIAGNOSIS — K55039 Acute (reversible) ischemia of large intestine, extent unspecified: Principal | ICD-10-CM | POA: Insufficient documentation

## 2021-07-24 DIAGNOSIS — I7410 Embolism and thrombosis of unspecified parts of aorta: Secondary | ICD-10-CM

## 2021-07-24 DIAGNOSIS — I10 Essential (primary) hypertension: Secondary | ICD-10-CM | POA: Insufficient documentation

## 2021-07-24 DIAGNOSIS — Z7982 Long term (current) use of aspirin: Secondary | ICD-10-CM | POA: Diagnosis not present

## 2021-07-24 DIAGNOSIS — R7303 Prediabetes: Secondary | ICD-10-CM | POA: Insufficient documentation

## 2021-07-24 DIAGNOSIS — K922 Gastrointestinal hemorrhage, unspecified: Secondary | ICD-10-CM | POA: Diagnosis not present

## 2021-07-24 DIAGNOSIS — I714 Abdominal aortic aneurysm, without rupture, unspecified: Secondary | ICD-10-CM | POA: Diagnosis not present

## 2021-07-24 DIAGNOSIS — K625 Hemorrhage of anus and rectum: Secondary | ICD-10-CM | POA: Diagnosis not present

## 2021-07-24 DIAGNOSIS — I708 Atherosclerosis of other arteries: Secondary | ICD-10-CM | POA: Diagnosis not present

## 2021-07-24 DIAGNOSIS — K6389 Other specified diseases of intestine: Secondary | ICD-10-CM | POA: Diagnosis not present

## 2021-07-24 DIAGNOSIS — R109 Unspecified abdominal pain: Secondary | ICD-10-CM | POA: Diagnosis not present

## 2021-07-24 DIAGNOSIS — I7143 Infrarenal abdominal aortic aneurysm, without rupture: Secondary | ICD-10-CM | POA: Diagnosis not present

## 2021-07-24 DIAGNOSIS — Z87891 Personal history of nicotine dependence: Secondary | ICD-10-CM | POA: Diagnosis not present

## 2021-07-24 DIAGNOSIS — Z20822 Contact with and (suspected) exposure to covid-19: Secondary | ICD-10-CM | POA: Insufficient documentation

## 2021-07-24 DIAGNOSIS — E7800 Pure hypercholesterolemia, unspecified: Secondary | ICD-10-CM | POA: Diagnosis present

## 2021-07-24 HISTORY — DX: Hypokalemia: E87.6

## 2021-07-24 LAB — ABO/RH: ABO/RH(D): O NEG

## 2021-07-24 LAB — URINALYSIS, ROUTINE W REFLEX MICROSCOPIC
Bilirubin Urine: NEGATIVE
Glucose, UA: NEGATIVE mg/dL
Ketones, ur: 5 mg/dL — AB
Leukocytes,Ua: NEGATIVE
Nitrite: NEGATIVE
Protein, ur: NEGATIVE mg/dL
Specific Gravity, Urine: 1.017 (ref 1.005–1.030)
pH: 6 (ref 5.0–8.0)

## 2021-07-24 LAB — LACTIC ACID, PLASMA
Lactic Acid, Venous: 1.5 mmol/L (ref 0.5–1.9)
Lactic Acid, Venous: 1.2 mmol/L (ref 0.5–1.9)

## 2021-07-24 LAB — COMPREHENSIVE METABOLIC PANEL
ALT: 17 U/L (ref 0–44)
AST: 23 U/L (ref 15–41)
Albumin: 4.4 g/dL (ref 3.5–5.0)
Alkaline Phosphatase: 60 U/L (ref 38–126)
Anion gap: 11 (ref 5–15)
BUN: 18 mg/dL (ref 8–23)
CO2: 26 mmol/L (ref 22–32)
Calcium: 9.4 mg/dL (ref 8.9–10.3)
Chloride: 100 mmol/L (ref 98–111)
Creatinine, Ser: 0.9 mg/dL (ref 0.44–1.00)
GFR, Estimated: >60 mL/min (ref >60)
Glucose, Bld: 114 mg/dL — ABNORMAL HIGH (ref 70–99)
Potassium: 3.1 mmol/L — ABNORMAL LOW (ref 3.5–5.1)
Sodium: 137 mmol/L (ref 135–145)
Total Bilirubin: 0.7 mg/dL (ref 0.3–1.2)
Total Protein: 8.4 g/dL — ABNORMAL HIGH (ref 6.5–8.1)

## 2021-07-24 LAB — CBC
HCT: 43.9 % (ref 36.0–46.0)
HCT: 44.3 % (ref 36.0–46.0)
Hemoglobin: 14.5 g/dL (ref 12.0–15.0)
Hemoglobin: 14.3 g/dL (ref 12.0–15.0)
MCH: 28.6 pg (ref 26.0–34.0)
MCH: 28.4 pg (ref 26.0–34.0)
MCHC: 33 g/dL (ref 30.0–36.0)
MCHC: 32.3 g/dL (ref 30.0–36.0)
MCV: 86.6 fL (ref 80.0–100.0)
MCV: 88.1 fL (ref 80.0–100.0)
Platelets: 266 10*3/uL (ref 150–400)
Platelets: 257 10*3/uL (ref 150–400)
RBC: 5.07 MIL/uL (ref 3.87–5.11)
RBC: 5.03 MIL/uL (ref 3.87–5.11)
RDW: 14 % (ref 11.5–15.5)
RDW: 14.2 % (ref 11.5–15.5)
WBC: 17.5 10*3/uL — ABNORMAL HIGH (ref 4.0–10.5)
WBC: 15.4 10*3/uL — ABNORMAL HIGH (ref 4.0–10.5)
nRBC: 0 % (ref 0.0–0.2)
nRBC: 0 % (ref 0.0–0.2)

## 2021-07-24 LAB — TYPE AND SCREEN
ABO/RH(D): O NEG
Antibody Screen: NEGATIVE

## 2021-07-24 LAB — POC OCCULT BLOOD, ED: Fecal Occult Bld: POSITIVE — AB

## 2021-07-24 LAB — PROTIME-INR
INR: 1.1 (ref 0.8–1.2)
Prothrombin Time: 14.2 seconds (ref 11.4–15.2)

## 2021-07-24 LAB — GLUCOSE, CAPILLARY: Glucose-Capillary: 104 mg/dL — ABNORMAL HIGH (ref 70–99)

## 2021-07-24 LAB — MAGNESIUM: Magnesium: 2 mg/dL (ref 1.7–2.4)

## 2021-07-24 MED ORDER — PANTOPRAZOLE SODIUM 40 MG IV SOLR
40.0000 mg | INTRAVENOUS | Status: DC
  Administered 2021-07-24 – 2021-07-26 (×3): 40 mg via INTRAVENOUS
  Filled 2021-07-24 (×3): qty 40

## 2021-07-24 MED ORDER — ACETAMINOPHEN 325 MG PO TABS
650.0000 mg | ORAL_TABLET | Freq: Four times a day (QID) | ORAL | Status: DC | PRN
  Administered 2021-07-25 – 2021-07-26 (×3): 650 mg via ORAL
  Filled 2021-07-24 (×4): qty 2

## 2021-07-24 MED ORDER — IOHEXOL 350 MG/ML SOLN
100.0000 mL | Freq: Once | INTRAVENOUS | Status: AC | PRN
  Administered 2021-07-24: 100 mL via INTRAVENOUS

## 2021-07-24 MED ORDER — ACETAMINOPHEN 650 MG RE SUPP: 650.0000 mg | Freq: Four times a day (QID) | RECTAL | Status: DC | PRN

## 2021-07-24 MED ORDER — ONDANSETRON HCL 4 MG/2ML IJ SOLN: 4.0000 mg | Freq: Four times a day (QID) | INTRAMUSCULAR | Status: DC | PRN

## 2021-07-24 MED ORDER — POTASSIUM CHLORIDE 10 MEQ/100ML IV SOLN
10.0000 meq | INTRAVENOUS | Status: AC
Start: 2021-07-24 — End: 2021-07-24
  Administered 2021-07-24 (×2): 10 meq via INTRAVENOUS
  Filled 2021-07-24 (×2): qty 100

## 2021-07-24 MED ORDER — POTASSIUM CHLORIDE IN NACL 20-0.9 MEQ/L-% IV SOLN
INTRAVENOUS | Status: DC
  Filled 2021-07-24 (×5): qty 1000

## 2021-07-24 MED ORDER — IOHEXOL 350 MG/ML SOLN
100.0000 mL | Freq: Once | INTRAVENOUS | Status: AC | PRN
  Administered 2021-07-24: 80 mL via INTRAVENOUS

## 2021-07-24 MED ORDER — ONDANSETRON HCL 4 MG PO TABS: 4.0000 mg | ORAL_TABLET | Freq: Four times a day (QID) | ORAL | Status: DC | PRN

## 2021-07-24 MED ORDER — MAGNESIUM SULFATE 2 GM/50ML IV SOLN
2.0000 g | Freq: Once | INTRAVENOUS | Status: AC
  Administered 2021-07-24: 2 g via INTRAVENOUS
  Filled 2021-07-24: qty 50

## 2021-07-24 NOTE — Consult Note (Signed)
[facility] [place]  ASSESSMENT / PLAN: 70 y.o. female with abdominal pain and rectal bleeding over the past several days.  CT evidence of sigmoid colon inflammation and mesenteric atherosclerosis without pneumatosis or other signs of acute mesenteric ischemia.  Differential includes inflammatory, infectious, or ischemic colitis.  Acute or chronic mesenteric ischemia unlikely given her constellation of symptoms.  Recommend gastroenterology evaluation for consideration of endoscopy; supportive care with antibiotics and bowel rest.  No role for revascularization at this time.  We will follow-up with her as an outpatient in 4 weeks to initiate surveillance of mesenteric atherosclerosis.  Please call for questions.  CHIEF COMPLAINT: Abdominal pain, rectal bleeding  HISTORY OF PRESENT ILLNESS: Sonya Woods is a 70 y.o. female admitted to the internal medicine service for evaluation of 4-day history of episodic lower abdominal pain associated with rectal bleeding.  Patient reports no other associated symptoms.  She specifically denies malaise, fever, chills, nausea, vomiting, diarrhea, or constipation.  She reports no unintentional weight loss.  She denies postprandial pain or food fear.  She reports her pain is episodic.  There are no alleviating or exacerbating features.  On my evaluation, the patient has had no further bleeding per rectum and has no abdominal pain.  Past Medical History:  Diagnosis Date   Benign tumor of right femur, s/p removal    Chicken pox    Chronic lower back pain    Essential hypertension 08/03/2018   Former smoker, 100 pack year Hx, quit < 10 years ago    Fracture    left foot, right foot and left arm    Low vitamin D level    Measles    Morbid obesity ([place]) 08/03/2018   Pure hypercholesterolemia 08/03/2018   Controlled on current regimen. No side effects. Will continue and recheck in 4 months at physical.     Past Surgical History:  Procedure  Laterality Date   APPENDECTOMY     CATARACT EXTRACTION Right    [place] SURGERY  06/2017   nose    S/P Bone Tumor Surgery       Family History  Problem Relation Age of Onset   Dementia Mother    Mental illness Mother    Hypertension Father    Hyperlipidemia Father    Heart attack Father     Social History   Socioeconomic History   Marital status: Divorced    Spouse name: Not on file   Number of children: Not on file   Years of education: Not on file   Highest education level: High school graduate  Occupational History   Occupation: Accountant  Tobacco Use   Smoking status: Former    Types: Cigarettes    Quit date: 08/03/2011    Years since quitting: 9.9   Smokeless tobacco: Never  Substance and Sexual Activity   Alcohol use: Never   Drug use: Never   Sexual activity: Not Currently    Partners: Male  Other Topics Concern   Not on file  Social History Narrative   Not on file   Social Determinants of Health   Financial Resource Strain: Not on file  Food Insecurity: Not on file  Transportation Needs: Not on file  Physical Activity: Not on file  Stress: Not on file  Social Connections: Not on file  Intimate Partner Violence: Not on file    Allergies  Allergen Reactions   Angiotensin Receptor Blockers    Losartan Potassium-Hctz  Tetanus Toxoids    Thiazide-Type Diuretics    Tramadol Other (See Comments)    Current Facility-Administered Medications  Medication Dose Route Frequency Provider Last Rate Last Admin   0.9 % NaCl with KCl 20 mEq/ L  infusion   Intravenous Continuous Reubin Milan, MD 100 mL/hr at 07/24/21 1952 New Bag at 07/24/21 1952   acetaminophen (TYLENOL) tablet 650 mg  650 mg Oral Q6H PRN Reubin Milan, MD       Or   acetaminophen (TYLENOL) suppository 650 mg  650 mg Rectal Q6H PRN Reubin Milan, MD       magnesium sulfate IVPB 2 g 50 mL  2 g Intravenous Once Reubin Milan, MD        ondansetron [facility]) tablet 4 mg  4 mg Oral Q6H PRN Reubin Milan, MD       Or   ondansetron [facility]) injection 4 mg  4 mg Intravenous Q6H PRN Reubin Milan, MD       pantoprazole (PROTONIX) injection 40 mg  40 mg Intravenous Q24H Reubin Milan, MD   40 mg at 07/24/21 2002    REVIEW OF SYSTEMS:  [X]  denotes positive finding, [ ]  denotes negative finding Cardiac  Comments:  Chest pain or chest pressure:    Shortness of breath upon exertion:    Short of breath when lying flat:    Irregular heart rhythm:        Vascular    Pain in calf, thigh, or hip brought on by ambulation:    Pain in feet at night that wakes you up from your sleep:     Blood clot in your veins:    Leg swelling:         Pulmonary    Oxygen at home:    Productive cough:     Wheezing:         Neurologic    Sudden weakness in arms or legs:     Sudden numbness in arms or legs:     Sudden onset of difficulty speaking or slurred speech:    Temporary loss of vision in one eye:     Problems with dizziness:         Gastrointestinal    Blood in stool:  x   Vomited blood:         Genitourinary    Burning when urinating:     Blood in urine:        Psychiatric    Major depression:         Hematologic    Bleeding problems:    Problems with blood clotting too easily:        Skin    Rashes or ulcers:        Constitutional    Fever or chills:      PHYSICAL EXAM Vitals:   07/24/21 1540 07/24/21 1625 07/24/21 1836 07/24/21 2003  BP: 119/69 96/69 (!) 144/58 (!) 141/54  Pulse: 92 92 77 75  Resp: 16 16 18 18   Temp:   98.7 F (37.1 C) 98.5 F (36.9 C)  TempSrc:    Oral  SpO2: 99% 98% 100% 100%    Constitutional: well appearing. no distress. Appears well nourished.  Neurologic: CN intact. no focal findings. no sensory loss. Psychiatric:  Mood and affect symmetric and appropriate. Eyes:  No icterus. No conjunctival pallor. Ears, nose, throat:  mucous membranes moist. Midline trachea.   Cardiac: regular rate and rhythm.  Respiratory:  unlabored. Abdominal:  soft, non-tender, non-distended.  Peripheral vascular: 2+ DP pulses bilaterally Extremity: no edema. no cyanosis. no pallor.  Skin: no gangrene. no ulceration.  Lymphatic: no Stemmer's sign. no palpable lymphadenopathy.  PERTINENT LABORATORY AND RADIOLOGIC DATA  Most recent CBC CBC Latest Ref Rng & Units 07/24/2021 07/24/2021 03/11/2020  WBC 4.0 - 10.5 K/uL 15.4(H) 17.5(H) 10.7(H)  Hemoglobin 12.0 - 15.0 g/dL 14.3 14.5 13.5  Hematocrit 36.0 - 46.0 % 44.3 43.9 40.7  Platelets 150 - 400 K/uL 257 266 266.0     Most recent CMP CMP Latest Ref Rng & Units 07/24/2021 03/11/2020 02/02/2018  Glucose 70 - 99 mg/dL 114(H) 99 -  BUN 8 - 23 mg/dL 18 15 16   Creatinine 0.44 - 1.00 mg/dL 0.90 0.87 0.8  Sodium 135 - 145 mmol/L 137 140 141  Potassium 3.5 - 5.1 mmol/L 3.1(L) 3.7 3.8  Chloride 98 - 111 mmol/L 100 101 -  CO2 22 - 32 mmol/L 26 32 -  Calcium 8.9 - 10.3 mg/dL 9.4 9.6 -  Total Protein 6.5 - 8.1 g/dL 8.4(H) 7.2 -  Total Bilirubin 0.3 - 1.2 mg/dL 0.7 0.5 -  Alkaline Phos 38 - 126 U/L 60 58 75  AST 15 - 41 U/L 23 17 17   ALT 0 - 44 U/L 17 12 13     Renal function CrCl cannot be calculated (Unknown ideal weight.).  Hgb A1c MFr Bld (%)  Date Value  02/08/2020 5.7    LDL Cholesterol  Date Value Ref Range Status  03/11/2020 65 0 - 99 mg/dL Final     Vascular Imaging: CT angiogram reviewed in detail.  There is atherosclerosis about the origin of the celiac, superior mesenteric, and inferior mesenteric arteries which causes stenosis.  There is flow visualized beyond the origins of all of these vessels arguing against thrombotic occlusion.  A small infrarenal abdominal aortic aneurysm measuring 3.1 cm is noted.  Intestines appear perfused and there are no secondary signs of acute mesenteric ischemia including pneumatosis.  The sigmoid colon appears inflamed.  There is diverticulosis throughout the colon.Sonya Woods  Stanford Breed, MD [facility] Phone Number: [phone] 07/24/2021 8:07 PM  Total time spent on preparing this encounter including chart review, data review, collecting history, examining the patient, coordinating care for this new patient, 60 minutes.  Portions of this report may have been transcribed using voice recognition software.  Every effort has been made to ensure accuracy; however, inadvertent computerized transcription errors may still be present.

## 2021-07-24 NOTE — Telephone Encounter (Signed)
Nurse Assessment Nurse: Ronnald Ramp, RN, Miranda Date/Time (Eastern Time): 07/24/2021 9:41:18 AM Confirm and document reason for call. If symptomatic, describe symptoms. ---Caller states for the last 3 days, she has been having blood stools and stomach cramps. Does the patient have any new or worsening symptoms? ---Yes Will a triage be completed? ---Yes Related visit to physician within the last 2 weeks? ---No Does the PT have any chronic conditions? (i.e. diabetes, asthma, this includes High risk factors for pregnancy, etc.) ---Yes List chronic conditions. ---HTN, High Cholesterol, Water retention Is this a behavioral health or substance abuse call? ---No Guidelines Guideline Title Affirmed Question Affirmed Notes Nurse Date/Time (Eastern Time) Rectal Bleeding [1] MODERATE rectal bleeding (small blood clots, passing blood without stool, or toilet water turns Ronnald Ramp, Therapist, sports, Miranda 07/24/2021 9:42:18 AM PLEASE NOTE: All timestamps contained within this report are represented as [place] Standard Time. CONFIDENTIALTY NOTICE: This fax transmission is intended only for the addressee. It contains information that is legally privileged, confidential or otherwise protected from use or disclosure. If you are not the intended recipient, you are strictly prohibited from reviewing, disclosing, copying using or disseminating any of this information or taking any action in reliance on or regarding this information. If you have received this fax in error, please notify us immediately by telephone so that we can arrange for its return to [place]. Phone: [phone], Toll-Free: [phone], Fax: [phone] Page: 2 of 2 Call Id: [id number] Guidelines Guideline Title Affirmed Question Affirmed Notes Nurse Date/Time Eilene Ghazi Time) red) AND [2] more than once a day Disp. Time Eilene Ghazi Time) Disposition Final User 07/24/2021 9:44:17 AM Go to ED Now Yes Ronnald Ramp, RN, Miranda Caller Disagree/Comply Comply Caller  Understands Yes PreDisposition Did not know what to do Care Advice Given Per Guideline GO TO ED NOW: * You need to be seen in the Emergency Department. * Go to the ED at ___________ [place] now. Drive carefully. NOTE TO TRIAGER - DRIVING: * Another adult should drive. BRING MEDICINES: * Bring a list of your current medicines when you go to the Emergency Department (ER). * Bring the pill bottles too. This will help the doctor (or NP/PA) to make certain you are taking the right medicines and the right dose. CARE ADVICE given per Rectal Bleeding (Adult) guideline. Referrals [facility] - ED

## 2021-07-24 NOTE — Telephone Encounter (Signed)
FYI, TOC scheduled for 08/01/2021

## 2021-07-24 NOTE — ED Triage Notes (Signed)
Pt presents with c/o rectal bleeding that has been going on for 3 days. Pt reports the blood is bright red in color.

## 2021-07-24 NOTE — H&P (Signed)
History and Physical    Dorien Bessent EYC:[id number] DOB: 1950/12/29 DOA: 07/24/2021  PCP: Pcp, No  Patient coming from: Home.  I have personally briefly reviewed patient's old medical records in [place]  Chief Complaint: Rectal bleeding for 3 days.  HPI: Lani Havlik is a 70 y.o. female with medical history significant of benign right femur tumor, varicella-zoster, chronic lower back pain, hypertension, history of cigarette smoking, bilateral feet and left arm fractures, vitamin D deficiency, measles, morbid obesity, pure hypercholesterolemia who is coming to the emergency department with complaints of a having multiple episodes of rectal bleed for the past 3 days.  The patient stated that about 2 weeks ago she had some rectal bleeding for a day but subsided spontaneously.  However, over the last 3 days she has been having episodes of rectal bleeding associated with sharp abdominal pain.  She stated that last night the pain was so intense that woke her up 4 times.  The pain is intermittent and sharp in nature.  She has felt mildly nauseous when having intense pain but denies emesis, constipation or melena.  No dysuria, flank pain, frequency or hematuria.  Denied fever, chills, rhinorrhea, sore throat, dyspnea, wheezing or hemoptysis.  No chest pain, palpitations, diaphoresis, PND, orthopnea or pitting edema of the lower extremities.  No polyuria, polydipsia, polyphagia or blurred vision.  ED Course: Initial vital signs were temperature 97.7 F, pulse 94, respiration 18, BP 124/98 mmHg O2 sat 100% on room air.  No meds were given in the emergency department.  Dr. Sherry Ruffing consulted vascular surgery and gastroenterology.  Lab work: Fecal occult blood was positive.  Urinalysis showed moderate hemoglobinuria and ketonuria 5 mg/dL.  Rare bacteria on microscopic examination.  CBC showed a white count of 17.5, hemoglobin 14.5 g/dL platelets 266.  CMP showed a potassium of 3.1 mmol/L, glucose 114  mg/dL and total protein 8.4 g/dL.  Imaging: CT abdomen/pelvis showed colonic stool mucosal edema and mild pericolonic inflammation of the left colon from the splenic flexure to the sigmoid colon which is most consistent with segmental colitis.  Ischemic colitis is a possibility.  CTA abdomen showed diffuse atherosclerotic disease throughout the abdomen and pelvis.  There was a 3.2 cm infrarenal AAA there was evidence of blood flow to the left colic and sigmoid branches associated with the area of colonic inflammation.  Please see images and full radiology report for further details.  Review of Systems: As per HPI otherwise all other systems reviewed and are negative.  Past Medical History:  Diagnosis Date   Benign tumor of right femur, s/p removal    Chicken pox    Chronic lower back pain    Essential hypertension 08/03/2018   Former smoker, 100 pack year Hx, quit < 10 years ago    Fracture    left foot, right foot and left arm    Low vitamin D level    Measles    Morbid obesity ([place]) 08/03/2018   Pure hypercholesterolemia 08/03/2018   Controlled on current regimen. No side effects. Will continue and recheck in 4 months at physical.     Past Surgical History:  Procedure Laterality Date   APPENDECTOMY     CATARACT EXTRACTION Right    CESAREAN [place] SURGERY  06/2017   nose    S/P Bone Tumor Surgery       Social History  reports that she quit smoking about 9 years ago. Her smoking use included cigarettes. She has  never used smokeless tobacco. She reports that she does not drink alcohol and does not use drugs.  Allergies  Allergen Reactions   Angiotensin Receptor Blockers    Losartan Potassium-Hctz    Tetanus Toxoids    Thiazide-Type Diuretics    Tramadol Other (See Comments)    Family History  Problem Relation Age of Onset   Dementia Mother    Mental illness Mother    Hypertension Father    Hyperlipidemia Father    Heart attack Father    Prior to  Admission medications   Medication Sig Start Date End Date Taking? Authorizing Provider  acetaminophen (TYLENOL) 500 MG tablet Take 1,000 mg by mouth every 6 (six) hours as needed for mild pain.   Yes [provider]  amLODipine (NORVASC) 10 MG tablet TAKE 1 TABLET(10 MG) BY MOUTH DAILY Patient taking differently: Take 10 mg by mouth daily. 02/10/21  Yes Orma Flaming, MD  aspirin 81 MG chewable tablet Chew 81 mg by mouth daily.   Yes [provider]  B Complex-C (B-COMPLEX WITH VITAMIN C) tablet Take 1 tablet by mouth daily.   Yes [provider]  cholecalciferol (VITAMIN D) 1000 units tablet Take 2,000 Units by mouth daily.   Yes [provider]  Coenzyme Q10 (CO Q 10 PO) Take 200 mg by mouth.   Yes [provider]  hydrochlorothiazide (HYDRODIURIL) 25 MG tablet TAKE 1 TABLET(25 MG) BY MOUTH DAILY Patient taking differently: Take 25 mg by mouth daily. 02/20/21  Yes Orma Flaming, MD  rosuvastatin (CRESTOR) 5 MG tablet TAKE 1 TABLET(5 MG) BY MOUTH DAILY Patient taking differently: Take 5 mg by mouth daily. 07/07/21  Yes Marin Olp, MD   Physical Exam: Vitals:   07/24/21 1326 07/24/21 1430 07/24/21 1540 07/24/21 1625  BP: (!) 142/54 (!) 157/49 119/69 96/69  Pulse: 79 84 92 92  Resp: 13 15 16 16   Temp:      TempSrc:      SpO2: 100% 100% 99% 98%   Constitutional: NAD, calm, comfortable Eyes: PERRL, lids and conjunctivae normal ENMT: Mucous membranes are moist. Posterior pharynx clear of any exudate or lesions. Neck: normal, supple, no masses, no thyromegaly Respiratory: clear to auscultation bilaterally, no wheezing, no crackles. Normal respiratory effort. No accessory muscle use.  Cardiovascular: Regular rate and rhythm, no murmurs / rubs / gallops. No extremity edema. 2+ pedal pulses. No carotid bruits.  Abdomen: No distention.  Bowel sounds positive.  Soft, mild LUQ tenderness without guarding or rebound, no masses palpated. No  hepatosplenomegaly. Musculoskeletal: no clubbing / cyanosis.Good ROM, no contractures. Normal muscle tone.  Skin: Her skin is scaly and dry.  Multiple hyperkeratotic lesions on pretibial area. Neurologic: CN 2-12 grossly intact. Sensation intact, DTR normal. Strength 5/5 in all 4.  Psychiatric: Normal judgment and insight. Alert and oriented x 3. Normal mood.    Labs on Admission: I have personally reviewed following labs and imaging studies  CBC: Recent Labs  Lab 07/24/21 1048  WBC 17.5*  HGB 14.5  HCT 43.9  MCV 86.6  PLT 323   Basic Metabolic Panel: Recent Labs  Lab 07/24/21 1048  NA 137  K 3.1*  CL 100  CO2 26  GLUCOSE 114*  BUN 18  CREATININE 0.90  CALCIUM 9.4   GFR: CrCl cannot be calculated (Unknown ideal weight.).  Liver Function Tests: Recent Labs  Lab 07/24/21 1048  AST 23  ALT 17  ALKPHOS 60  BILITOT 0.7  PROT 8.4*  ALBUMIN 4.4  Urine analysis:    Component Value Date/Time   COLORURINE YELLOW 07/24/2021 1148   APPEARANCEUR HAZY (A) 07/24/2021 1148   LABSPEC 1.017 07/24/2021 1148   PHURINE 6.0 07/24/2021 1148   GLUCOSEU NEGATIVE 07/24/2021 1148   HGBUR MODERATE (A) 07/24/2021 1148   BILIRUBINUR NEGATIVE 07/24/2021 1148   KETONESUR 5 (A) 07/24/2021 1148   PROTEINUR NEGATIVE 07/24/2021 1148   NITRITE NEGATIVE 07/24/2021 [place] 07/24/2021 1148   Radiological Exams on Admission: CT ABDOMEN PELVIS W CONTRAST  Result Date: 07/24/2021 CLINICAL DATA:  Acute abdominal pain. EXAM: CT ABDOMEN AND PELVIS WITH CONTRAST TECHNIQUE: Multidetector CT imaging of the abdomen and pelvis was performed using the standard protocol following bolus administration of intravenous contrast. CONTRAST:  73mL OMNIPAQUE IOHEXOL 350 MG/ML SOLN COMPARISON:  None. FINDINGS: Lower chest: Lung bases are clear. Hepatobiliary: No focal hepatic lesion. No biliary duct dilatation. Common bile duct is normal. Pancreas: Pancreas is normal. No ductal dilatation.  No pancreatic inflammation. Spleen: Normal spleen Adrenals/urinary tract: Adrenal glands and kidneys are normal. The ureters and bladder normal. Stomach/Bowel: Stomach, small-bowel and cecum are normal. The appendix is not identified but there is no pericecal inflammation to suggest appendicitis. Ascending and transverse colon normal. Beginning in the distal aspect of the transverse colon and continuing through the splenic flexure of the colon and descending colon, there is diffuse submucosal edema with luminal narrowing throughout this section of bowel. There is mild pericolonic fat stranding from the splenic flexure through the RIGHT pericolic gutter. There is no pneumatosis or portal venous gas. There are several diverticula the descending colon and sigmoid colon however the above colonic process appears more diffuse than focal diverticulitis. There is significant intimal calcification in the aorta and at the ostia the SMA and celiac trunk. These vessels are grossly opacified on portal venous phase imaging. The SMA and celiac trunk share a common origin. Calcifications in the proximal IMA. Diverticula sigmoid colon.  Rectum normal Vascular/Lymphatic: Abdominal aorta is normal caliber with atherosclerotic calcification. See GI section for description mesenteric arteries. There is no retroperitoneal or periportal lymphadenopathy. No pelvic lymphadenopathy. Reproductive: Uterus and adnexa unremarkable. Other: No free fluid or intraperitoneal free air. Musculoskeletal: No aggressive osseous lesion. IMPRESSION: 1. Colonic submucosal edema and mild pericolonic inflammation of the LEFT colon from the splenic flexure to the sigmoid colon is most consistent with segmental colitis. Differential would include ischemic colitis, infectious colitis, drug induced colitis, or inflammatory bowel disease. Watershed vascular distribution could favor ischemic colitis. No pneumatosis or portal venous gas. Intimal and ostial  calcifications of the aorta and SMA respectively. 2. Sigmoid colon descending colon diverticulosis without clear evidence of diverticulitis. Findings conveyed toCHRISTOPHER TEGELER on 07/24/2021  at12:33. Electronically Signed   By: Suzy Bouchard M.D.   On: 07/24/2021 12:37   CT Angio Abd/Pel w/ and/or w/o  Result Date: 07/24/2021 CLINICAL DATA:  Evaluate colitis. EXAM: CTA ABDOMEN AND PELVIS WITHOUT AND WITH CONTRAST TECHNIQUE: Multidetector CT imaging of the abdomen and pelvis was performed using the standard protocol during bolus administration of intravenous contrast. Multiplanar reconstructed images and MIPs were obtained and reviewed to evaluate the vascular anatomy. CONTRAST:  157mL OMNIPAQUE IOHEXOL 350 MG/ML SOLN COMPARISON:  CT abdomen and pelvis 07/24/2020 FINDINGS: VASCULAR Aorta: Extensive atherosclerotic disease in the abdominal aorta. Proximal abdominal aorta measures up to 2.9 cm. Infrarenal abdominal aorta measures 3.2 cm. Scattered areas of mural thrombus throughout the abdominal aorta. 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 Left gastric artery,  splenic artery and common hepatic artery patent. SMA: Origin of the SMA is very close to the origin the celiac trunk and there appears to be high-grade stenosis, greater than 50% at the origin. Diffuse atherosclerotic disease in the SMA. Renals: 2 right renal arteries with at least moderate stenosis involving the right superior renal artery. Mixed plaque at the origin of the left renal artery but difficult to quantify the degree of stenosis. No evidence for renal artery aneurysm. IMA: Patent with calcified plaque. There is arterial flow to the left lower colic and sigmoid branches at the area of inflammation. Inflow: Atherosclerotic calcifications in the common, internal and external iliac arteries. No significant stenosis involving the common or external iliac arteries. Proximal Outflow: Proximal femoral arteries are patent  bilaterally Veins: No obvious venous abnormality within the limitations of this arterial phase study. Review of the MIP images confirms the above findings. NON-VASCULAR Lower chest: Lung bases are clear. Hepatobiliary: Normal appearance of the liver and gallbladder. Pancreas: Unremarkable. No pancreatic ductal dilatation or surrounding inflammatory changes. Spleen: Normal in size without focal abnormality. Adrenals/Urinary Tract: Normal adrenal glands. Evidence for cortical scarring in both kidneys. No hydronephrosis. Normal urinary bladder. Stomach/Bowel: Again noted is pericolonic stranding throughout the descending colon. Scattered colonic diverticula. No evidence for bowel distention or obstruction. Normal appearance of the stomach. Lymphatic: No abdominal or pelvic lymph node enlargement. Reproductive: Uterus and bilateral adnexa are unremarkable. Other: Negative for ascites. Negative for free air. 5 mm low-density nodule in the anterior left lower abdomen on sequence 4 image 142 is nonspecific. No other significant nodularity in the omentum or peritoneal cavity. Musculoskeletal: Grade 2 anterolisthesis of L4 on L5 secondary to facet arthropathy. Disc space narrowing at L4-L5 and L5-S1. IMPRESSION: VASCULAR 1. Diffuse atherosclerotic disease throughout the abdomen and pelvis. 2. 3.2 cm infrarenal abdominal aortic aneurysm. Recommend follow-up every 3 years. Reference: J Am Coll Radiol 1610;96:045-409. 3. Origin of the celiac trunk and SMA are close to each other and concern for high-grade stenosis involving both of these vessels. No evidence for acute thrombus. However, this pattern disease could be associated with chronic mesenteric ischemia. 4. IMA is patent. There is evidence for blood flow to the left colic and sigmoid branches associated with the area of colonic inflammation. No evidence for acute thromboembolic disease in this area. 5. Probable bilateral renal artery stenosis as described. NON-VASCULAR 1.  Persistent inflammatory changes involving the descending colon and findings are similar to the recent comparison examination. Etiology for the colitis remains indeterminate. 2. Mild scarring in both kidneys. 3. Colonic diverticulosis. Electronically Signed   By: Markus Daft M.D.   On: 07/24/2021 14:19    EKG: Independently reviewed.   Assessment/Plan Principal Problem:   Rectal bleeding Questionable ischemic colitis. Observation/PCU. Continue IV fluids. Antiemetics as needed. Analgesics as needed. Protonix IV every 24 hours. Vascular surgery has been consulted. [facility] has been message for consult.  Active Problems:   Essential hypertension On amlodipine 10 mg p.o. daily. Currently held. Monitor BP and heart rate.    Pure hypercholesterolemia Currently on Crestor 5 mg p.o. daily.    Prediabetes Currently NPO. Check CBG every 6 hours.    Hypokalemia Continue K supplementation. Follow-up potassium level.    DVT prophylaxis: SCDs. Code Status:   Full code. Family Communication:   Disposition Plan:   Patient is from:  Home.  Anticipated DC to:  Home.  Anticipated DC date:  07/26/2021.  Anticipated DC barriers: Medical status.  Consults called:  Vascular  surgery (Dr. Stanford Breed) and [facility]. Admission status:  Observation/PCU.  Severity of Illness:  High severity after presenting with rectal bleeding for the past 3 days with work-up showing suspicion for ischemic bowel.  The patient will be admitted for close monitoring, symptoms treatment, further work-up, vascular surgery and GI evaluation.  Reubin Milan MD [facility]  How to contact the [facility] Attending or Consulting provider Guadalupe Guerra or covering provider during after hours [place], for this patient?   Check the care team in [facility] and look for a) attending/consulting [facility] provider listed and b) the [facility] team listed Log into [email or web address] and use [place]'s universal password to access. If you do not have the  password, please contact the hospital operator. Locate the [facility] provider you are looking for under [facility] and page to a number that you can be directly reached. If you still have difficulty reaching the provider, please page the [facility] (Director on Call) for the Hospitalists listed on amion for assistance.  07/24/2021, 4:30 PM   This document was prepared using Dragon voice recognition software and may contain some unintended transcription errors.

## 2021-07-24 NOTE — ED Provider Notes (Signed)
[facility] DEPT Provider Note   CSN: [id number] Arrival date & time: 07/24/21  1008     History Chief Complaint  Patient presents with   Rectal Bleeding    Sonya Woods is a 70 y.o. female.  The history is provided by the patient and medical records. No language interpreter was used.  Rectal Bleeding Quality:  Bright red and maroon Amount:  Moderate Duration:  4 days Timing:  Intermittent Chronicity:  New Context: not anal fissures, not hemorrhoids and not rectal pain   Similar prior episodes: no   Relieved by:  Nothing Worsened by:  Nothing Ineffective treatments:  None tried Associated symptoms: abdominal pain   Associated symptoms: no dizziness, no epistaxis, no fever, no hematemesis, no light-headedness, no loss of consciousness, no recent illness and no vomiting   Risk factors: no anticoagulant use       Past Medical History:  Diagnosis Date   Benign tumor of right femur, s/p removal    Chicken pox    Chronic lower back pain    Essential hypertension 08/03/2018   Former smoker, 100 pack year Hx, quit < 10 years ago    Fracture    left foot, right foot and left arm    Low vitamin D level    Measles    Morbid obesity ([place]) 08/03/2018   Pure hypercholesterolemia 08/03/2018   Controlled on current regimen. No side effects. Will continue and recheck in 4 months at physical.     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     Family History  Problem Relation Age of Onset   Dementia Mother    Mental illness Mother    Hypertension Father    Hyperlipidemia Father    Heart attack Father     Social History   Tobacco Use   Smoking status: Former    Types: Cigarettes    Quit date: 08/03/2011    Years since quitting: 9.9   Smokeless tobacco: Never  Substance Use Topics   Alcohol use: Never   Drug use: Never    Home Medications Prior to Admission medications   Medication Sig Start Date End Date Taking? Authorizing Provider  amLODipine (NORVASC) 10 MG tablet TAKE 1 TABLET(10 MG) BY MOUTH DAILY 02/10/21   Orma Flaming, MD  aspirin 81 MG chewable tablet Chew by mouth daily.    [provider]  B Complex-C (B-COMPLEX WITH VITAMIN C) tablet Take 1 tablet by mouth daily.    [provider]  cholecalciferol (VITAMIN D) 1000 units tablet Take 2,000 Units by mouth daily.    [provider]  Coenzyme Q10 (CO Q 10 PO) Take 200 mg by mouth.    [provider]  hydrochlorothiazide (HYDRODIURIL) 25 MG tablet TAKE 1 TABLET(25 MG)  BY MOUTH DAILY 02/20/21   Orma Flaming, MD  rosuvastatin (CRESTOR) 5 MG tablet TAKE 1 TABLET(5 MG) BY MOUTH DAILY 07/07/21   Marin Olp, MD    Allergies    Losartan potassium-hctz, Tetanus toxoids, and Tramadol  Review of Systems   Review of Systems  Constitutional:  Negative for chills, diaphoresis, fatigue and fever.  HENT:  Negative for congestion and nosebleeds.   Respiratory:  Negative for cough, chest tightness, shortness of breath and wheezing.   Cardiovascular:  Negative for chest pain.  Gastrointestinal:  Positive for abdominal pain, blood in stool and hematochezia. Negative for abdominal distention, constipation, diarrhea, hematemesis, nausea and vomiting.  Genitourinary:  Negative for flank pain.  Musculoskeletal:   Negative for back pain, neck pain and neck stiffness.  Skin:  Negative for rash and wound.  Neurological:  Negative for dizziness, loss of consciousness, light-headedness and numbness.  Psychiatric/Behavioral:  Negative for agitation and confusion.   All other systems reviewed and are negative.  Physical Exam Updated Vital Signs BP (!) 124/98 (BP Location: Left Arm)   Pulse 94   Temp 97.7 F (36.5 C) (Oral)   Resp 18   SpO2 100%   Physical Exam Vitals and nursing note reviewed.  Constitutional:      General: She is not in acute distress.    Appearance: She is well-developed. She is not ill-appearing, toxic-appearing or diaphoretic.  HENT:     Head: Normocephalic and atraumatic.     Mouth/Throat:     Mouth: Mucous membranes are moist.     Pharynx: No oropharyngeal exudate or posterior oropharyngeal erythema.  Eyes:     Conjunctiva/sclera: Conjunctivae normal.     Pupils: Pupils are equal, round, and reactive to light.  Cardiovascular:     Rate and Rhythm: Normal rate and regular rhythm.     Heart sounds: No murmur heard. Pulmonary:     Effort: Pulmonary effort is normal. No respiratory distress.     Breath sounds: Normal breath sounds. No wheezing, rhonchi or rales.  Chest:     Chest wall: No tenderness.  Abdominal:     General: Abdomen is flat.     Palpations: Abdomen is soft.     Tenderness: There is no abdominal tenderness. There is no right CVA tenderness, left CVA tenderness, guarding or rebound.  Musculoskeletal:        General: No tenderness.     Cervical back: Neck supple.  Skin:    General: Skin is warm and dry.     Capillary Refill: Capillary refill takes less than 2 seconds.     Findings: No erythema.  Neurological:     General: No focal deficit present.     Mental Status: She is alert.  Psychiatric:        Mood and Affect: Mood normal.    ED Results / Procedures / Treatments   Labs (all labs ordered are listed, but only abnormal results are  displayed) Labs Reviewed  COMPREHENSIVE METABOLIC PANEL - Abnormal; Notable for the following components:      Result Value   Potassium 3.1 (*)    Glucose, Bld 114 (*)    Total Protein 8.4 (*)    All other components within normal limits  CBC - Abnormal; Notable for the following components:   WBC 17.5 (*)    All other components within normal limits  URINALYSIS, ROUTINE W REFLEX MICROSCOPIC - Abnormal; Notable for the following components:   APPearance HAZY (*)    Hgb  urine dipstick MODERATE (*)    Ketones, ur 5 (*)    Bacteria, UA RARE (*)    All other components within normal limits  POC OCCULT BLOOD, ED - Abnormal; Notable for the following components:   Fecal Occult Bld POSITIVE (*)    All other components within normal limits  LACTIC ACID, PLASMA  LACTIC ACID, PLASMA  PROTIME-INR  CBC  TYPE AND SCREEN  ABO/RH    EKG None  Radiology CT ABDOMEN PELVIS W CONTRAST  Result Date: 07/24/2021 CLINICAL DATA:  Acute abdominal pain. EXAM: CT ABDOMEN AND PELVIS WITH CONTRAST TECHNIQUE: Multidetector CT imaging of the abdomen and pelvis was performed using the standard protocol following bolus administration of intravenous contrast. CONTRAST:  16mL OMNIPAQUE IOHEXOL 350 MG/ML SOLN COMPARISON:  None. FINDINGS: Lower chest: Lung bases are clear. Hepatobiliary: No focal hepatic lesion. No biliary duct dilatation. Common bile duct is normal. Pancreas: Pancreas is normal. No ductal dilatation. No pancreatic inflammation. Spleen: Normal spleen Adrenals/urinary tract: Adrenal glands and kidneys are normal. The ureters and bladder normal. Stomach/Bowel: Stomach, small-bowel and cecum are normal. The appendix is not identified but there is no pericecal inflammation to suggest appendicitis. Ascending and transverse colon normal. Beginning in the distal aspect of the transverse colon and continuing through the splenic flexure of the colon and descending colon, there is diffuse submucosal edema with  luminal narrowing throughout this section of bowel. There is mild pericolonic fat stranding from the splenic flexure through the RIGHT pericolic gutter. There is no pneumatosis or portal venous gas. There are several diverticula the descending colon and sigmoid colon however the above colonic process appears more diffuse than focal diverticulitis. There is significant intimal calcification in the aorta and at the ostia the SMA and celiac trunk. These vessels are grossly opacified on portal venous phase imaging. The SMA and celiac trunk share a common origin. Calcifications in the proximal IMA. Diverticula sigmoid colon.  Rectum normal Vascular/Lymphatic: Abdominal aorta is normal caliber with atherosclerotic calcification. See GI section for description mesenteric arteries. There is no retroperitoneal or periportal lymphadenopathy. No pelvic lymphadenopathy. Reproductive: Uterus and adnexa unremarkable. Other: No free fluid or intraperitoneal free air. Musculoskeletal: No aggressive osseous lesion. IMPRESSION: 1. Colonic submucosal edema and mild pericolonic inflammation of the LEFT colon from the splenic flexure to the sigmoid colon is most consistent with segmental colitis. Differential would include ischemic colitis, infectious colitis, drug induced colitis, or inflammatory bowel disease. Watershed vascular distribution could favor ischemic colitis. No pneumatosis or portal venous gas. Intimal and ostial calcifications of the aorta and SMA respectively. 2. Sigmoid colon descending colon diverticulosis without clear evidence of diverticulitis. Findings conveyed toCHRISTOPHER Jacobi Nile on 07/24/2021  at12:33. Electronically Signed   By: Suzy Bouchard M.D.   On: 07/24/2021 12:37   CT Angio Abd/Pel w/ and/or w/o  Result Date: 07/24/2021 CLINICAL DATA:  Evaluate colitis. EXAM: CTA ABDOMEN AND PELVIS WITHOUT AND WITH CONTRAST TECHNIQUE: Multidetector CT imaging of the abdomen and pelvis was performed using the  standard protocol during bolus administration of intravenous contrast. Multiplanar reconstructed images and MIPs were obtained and reviewed to evaluate the vascular anatomy. CONTRAST:  181mL OMNIPAQUE IOHEXOL 350 MG/ML SOLN COMPARISON:  CT abdomen and pelvis 07/24/2020 FINDINGS: VASCULAR Aorta: Extensive atherosclerotic disease in the abdominal aorta. Proximal abdominal aorta measures up to 2.9 cm. Infrarenal abdominal aorta measures 3.2 cm. Scattered areas of mural thrombus throughout the abdominal aorta. 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 Left gastric artery, splenic artery and  common hepatic artery patent. SMA: Origin of the SMA is very close to the origin the celiac trunk and there appears to be high-grade stenosis, greater than 50% at the origin. Diffuse atherosclerotic disease in the SMA. Renals: 2 right renal arteries with at least moderate stenosis involving the right superior renal artery. Mixed plaque at the origin of the left renal artery but difficult to quantify the degree of stenosis. No evidence for renal artery aneurysm. IMA: Patent with calcified plaque. There is arterial flow to the left lower colic and sigmoid branches at the area of inflammation. Inflow: Atherosclerotic calcifications in the common, internal and external iliac arteries. No significant stenosis involving the common or external iliac arteries. Proximal Outflow: Proximal femoral arteries are patent bilaterally Veins: No obvious venous abnormality within the limitations of this arterial phase study. Review of the MIP images confirms the above findings. NON-VASCULAR Lower chest: Lung bases are clear. Hepatobiliary: Normal appearance of the liver and gallbladder. Pancreas: Unremarkable. No pancreatic ductal dilatation or surrounding inflammatory changes. Spleen: Normal in size without focal abnormality. Adrenals/Urinary Tract: Normal adrenal glands. Evidence for cortical scarring in both kidneys. No  hydronephrosis. Normal urinary bladder. Stomach/Bowel: Again noted is pericolonic stranding throughout the descending colon. Scattered colonic diverticula. No evidence for bowel distention or obstruction. Normal appearance of the stomach. Lymphatic: No abdominal or pelvic lymph node enlargement. Reproductive: Uterus and bilateral adnexa are unremarkable. Other: Negative for ascites. Negative for free air. 5 mm low-density nodule in the anterior left lower abdomen on sequence 4 image 142 is nonspecific. No other significant nodularity in the omentum or peritoneal cavity. Musculoskeletal: Grade 2 anterolisthesis of L4 on L5 secondary to facet arthropathy. Disc space narrowing at L4-L5 and L5-S1. IMPRESSION: VASCULAR 1. Diffuse atherosclerotic disease throughout the abdomen and pelvis. 2. 3.2 cm infrarenal abdominal aortic aneurysm. Recommend follow-up every 3 years. Reference: J Am Coll Radiol 2130;86:578-469. 3. Origin of the celiac trunk and SMA are close to each other and concern for high-grade stenosis involving both of these vessels. No evidence for acute thrombus. However, this pattern disease could be associated with chronic mesenteric ischemia. 4. IMA is patent. There is evidence for blood flow to the left colic and sigmoid branches associated with the area of colonic inflammation. No evidence for acute thromboembolic disease in this area. 5. Probable bilateral renal artery stenosis as described. NON-VASCULAR 1. Persistent inflammatory changes involving the descending colon and findings are similar to the recent comparison examination. Etiology for the colitis remains indeterminate. 2. Mild scarring in both kidneys. 3. Colonic diverticulosis. Electronically Signed   By: Markus Daft M.D.   On: 07/24/2021 14:19    Procedures Procedures   Medications Ordered in ED Medications  0.9 % NaCl with KCl 20 mEq/ L  infusion (has no administration in time range)  acetaminophen (TYLENOL) tablet 650 mg (has no  administration in time range)    Or  acetaminophen (TYLENOL) suppository 650 mg (has no administration in time range)  ondansetron (ZOFRAN) tablet 4 mg (has no administration in time range)    Or  ondansetron (ZOFRAN) injection 4 mg (has no administration in time range)  iohexol (OMNIPAQUE) 350 MG/ML injection 100 mL (80 mLs Intravenous Contrast Given 07/24/21 1206)  iohexol (OMNIPAQUE) 350 MG/ML injection 100 mL (100 mLs Intravenous Contrast Given 07/24/21 1332)    ED Course  I have reviewed the triage vital signs and the nursing notes.  Pertinent labs & imaging results that were available during my care of the patient were reviewed  by me and considered in my medical decision making (see chart for details).    MDM Rules/Calculators/A&P                           Sonya Woods is a 70 y.o. female with a past medical history significant for hypertension and hypercholesterolemia who presents with abdominal pain and rectal bleeding.  According to patient, for the last 4 days she is been having waxing and waning severe abdominal pain that comes and goes.  It is sharp in nature.  She reports it does not radiate and she has no other nausea and vomiting with it but she has had rectal bleeding.  She reports that 4 times overnight she woke up with severe pain and had rectal bleeding.  She reports is not trauma.  Denies any urinary symptoms including no hematuria, dysuria, or she has not been having stools reportedly but just the blood.  She denies any chest pain, shortness of breath, palpitations.  Denies syncope.  No history of rectal bleeding reported.  No history of hemorrhoids.  He reports he had a colonoscopy several years ago that was reportedly reassuring.  Patient is concerned about the continued bleeding and pain.  On exam, lungs are clear and chest is nontender.  Abdomen was nontender and auscultation was difficult due to body habitus.  Rectal exam was performed with a chaperone and there were  no hemorrhoids appreciated but there did appear to some gross blood on DRE.  Occult test was sent.  Exam otherwise unremarkable patient is very pleasant.  Patient will have lab testing including a CT study to look for diverticulitis, appendicitis, obstruction, or other cause of abdominal pain.  The pain did not raise her back and her blood pressure was nonelevated so I will suspicion there is aortic etiology of her sharp pain and rectal bleeding.  We will get a type and screen just in case she has severe anemic.  As she is not having pain currently, hold on pain medicine but if she begins to have some we will treat.  Patient agrees with management plan and will reassess after work-up.  CT scan returned showing a colitis that could be infectious versus inflammatory versus ischemic.  Her aorta does show some calcifications and although there is no evidence of dissection or aneurysm, I spoke with radiology in the phone who agrees that given her aortic disease is reasonable to get a CTA of the abdomen pelvis to rule out a blockage, thrombus, or ischemia causing her colitis.  I spoke with patient who agrees and we will get the CTA to further evaluate.  3:02 PM CTA has returned showing some new findings.  CT scan revealed she does have a 3.2 similar infrarenal aneurysm as well as areas of mural thrombus.  There is also significant stenosis of the SMA and celiac arteries but it appears there is blood flowing through the IMA which feeds the area of question.  Due to the stenosis, severe sharp abdominal pain, rectal bleeding, and abdominal vascular disease, will call vascular surgery to discuss if they think this is a mural thrombus causing transient ischemia in the abdomen versus infectious colitis.  Also discussed further management between getting admitted versus discharge home  3:36 PM Just talked to the patient again.  I discussed that since there is still not definitive cause and we are highly concerned  about this abdominal pain and rectal bleeding due to an ischemic injury,  patient agrees with admission for hemoglobin trending, symptom monitoring, and likely for GI to perform colonoscopy to assess for either ischemic bowel or infected bowel.  Vascular surgery will be the patient in consultation and will call GI as well.  We will call medicine for admission.  Medicine will bid, awaiting GI consultation.  After approximately an hour of waiting, GI had not yet been able to weigh in.  Will allow medicine to touch base with GI.   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 Rectal bleeding   2. Acute GI bleeding   3. Abdominal pain, unspecified abdominal location   4. Colitis   5. Aortic mural thrombus (HCC)   6. Celiac artery stenosis (HCC)   7. Superior mesenteric artery stenosis (HCC)     Disposition: Admit  This note was prepared with assistance of Dragon voice recognition software. Occasional wrong-word or sound-a-like substitutions may have occurred due to the inherent limitations of voice recognition software.     Daviel Allegretto, Gwenyth Allegra, MD 07/24/21 [phone]

## 2021-07-24 NOTE — Plan of Care (Signed)
Pt will remain free from falls and injury and maintain adequate VS throughout shift.

## 2021-07-25 DIAGNOSIS — K529 Noninfective gastroenteritis and colitis, unspecified: Secondary | ICD-10-CM | POA: Diagnosis not present

## 2021-07-25 DIAGNOSIS — R933 Abnormal findings on diagnostic imaging of other parts of digestive tract: Secondary | ICD-10-CM | POA: Diagnosis not present

## 2021-07-25 DIAGNOSIS — K559 Vascular disorder of intestine, unspecified: Secondary | ICD-10-CM | POA: Diagnosis not present

## 2021-07-25 DIAGNOSIS — R109 Unspecified abdominal pain: Secondary | ICD-10-CM

## 2021-07-25 DIAGNOSIS — K625 Hemorrhage of anus and rectum: Secondary | ICD-10-CM | POA: Diagnosis not present

## 2021-07-25 LAB — CBC
HCT: 39.7 % (ref 36.0–46.0)
Hemoglobin: 12.9 g/dL (ref 12.0–15.0)
MCH: 28.7 pg (ref 26.0–34.0)
MCHC: 32.5 g/dL (ref 30.0–36.0)
MCV: 88.2 fL (ref 80.0–100.0)
Platelets: 211 10*3/uL (ref 150–400)
RBC: 4.5 MIL/uL (ref 3.87–5.11)
RDW: 14.2 % (ref 11.5–15.5)
WBC: 15.7 10*3/uL — ABNORMAL HIGH (ref 4.0–10.5)
nRBC: 0 % (ref 0.0–0.2)

## 2021-07-25 LAB — BASIC METABOLIC PANEL
Anion gap: 8 (ref 5–15)
BUN: 15 mg/dL (ref 8–23)
CO2: 24 mmol/L (ref 22–32)
Calcium: 8.5 mg/dL — ABNORMAL LOW (ref 8.9–10.3)
Chloride: 104 mmol/L (ref 98–111)
Creatinine, Ser: 0.86 mg/dL (ref 0.44–1.00)
GFR, Estimated: >60 mL/min (ref >60)
Glucose, Bld: 95 mg/dL (ref 70–99)
Potassium: 3.3 mmol/L — ABNORMAL LOW (ref 3.5–5.1)
Sodium: 136 mmol/L (ref 135–145)

## 2021-07-25 LAB — PROCALCITONIN: Procalcitonin: <0.1 ng/mL

## 2021-07-25 LAB — GLUCOSE, CAPILLARY
Glucose-Capillary: 97 mg/dL (ref 70–99)
Glucose-Capillary: 84 mg/dL (ref 70–99)
Glucose-Capillary: 79 mg/dL (ref 70–99)

## 2021-07-25 MED ORDER — BISACODYL 10 MG RE SUPP
10.0000 mg | Freq: Once | RECTAL | Status: AC
  Administered 2021-07-25: 10 mg via RECTAL
  Filled 2021-07-25: qty 1

## 2021-07-25 MED ORDER — PEG-KCL-NACL-NASULF-NA ASC-C 100 G PO SOLR
0.5000 | Freq: Once | ORAL | Status: DC
Start: 2021-07-26 — End: 2021-07-25
  Filled 2021-07-25: qty 1

## 2021-07-25 MED ORDER — PEG-KCL-NACL-NASULF-NA ASC-C 100 G PO SOLR
0.5000 | Freq: Once | ORAL | Status: DC
  Filled 2021-07-25: qty 1

## 2021-07-25 MED ORDER — PEG-KCL-NACL-NASULF-NA ASC-C 100 G PO SOLR: 1.0000 | Freq: Once | ORAL | Status: DC

## 2021-07-25 MED ORDER — POTASSIUM CHLORIDE 10 MEQ/100ML IV SOLN
10.0000 meq | Freq: Once | INTRAVENOUS | Status: AC
  Administered 2021-07-25: 10 meq via INTRAVENOUS
  Filled 2021-07-25: qty 100

## 2021-07-25 NOTE — Consult Note (Signed)
Referring Provider:  [facility] Primary Care Physician:  Pcp, No Primary Gastroenterologist:  Althia Forts  Reason for Consultation:  Colitis, rectal bleeding  HPI: Floride Hutmacher is a 70 y.o. female with medical history significant of benign right femur tumor, varicella-zoster, chronic lower back pain, hypertension, history of cigarette smoking, bilateral feet and left arm fractures, vitamin D deficiency, measles, morbid obesity, pure hypercholesterolemia who presented to the ER at [facility] with complaints of rectal bleeding and crampy lower abdominal pain that has been progressive over the past 4-5 days.  She says that when it started she thought that she had to have a BM but only passed red blood.  She thought it would resolve, but it continued to occur and worsened over a few days.  She also has been having lower abdominal pain.  Prior to this she was having regular bowel movements ever day.  Says that she had a colonoscopy 15 years ago in [place].  She denies NSAID use, no blood thinners.  She denies any heartburn/reflux, dysphagia, weight loss, etc.  Hgb is normal.  BUN is normal.  WBC count elevated at 17.5 but down to 15.7 today.  CT of the abdomen and pelvis with contrast:  IMPRESSION: 1. Colonic submucosal edema and mild pericolonic inflammation of the LEFT colon from the splenic flexure to the sigmoid colon is most consistent with segmental colitis. Differential would include ischemic colitis, infectious colitis, drug induced colitis, or inflammatory bowel disease. Watershed vascular distribution could favor ischemic colitis. No pneumatosis or portal venous gas. Intimal and ostial calcifications of the aorta and SMA respectively. 2. Sigmoid colon descending colon diverticulosis without clear evidence of diverticulitis.  CT angio of the abdomen and pelvis:  IMPRESSION: VASCULAR   1. Diffuse atherosclerotic disease throughout the abdomen and pelvis. 2. 3.2 cm infrarenal abdominal aortic  aneurysm. Recommend follow-up every 3 years. Reference: J Am Coll Radiol 6283;66:294-765. 3. Origin of the celiac trunk and SMA are close to each other and concern for high-grade stenosis involving both of these vessels. No evidence for acute thrombus. However, this pattern disease could be associated with chronic mesenteric ischemia. 4. IMA is patent. There is evidence for blood flow to the left colic and sigmoid branches associated with the area of colonic inflammation. No evidence for acute thromboembolic disease in this area. 5. Probable bilateral renal artery stenosis as described.   NON-VASCULAR   1. Persistent inflammatory changes involving the descending colon and findings are similar to the recent comparison examination. Etiology for the colitis remains indeterminate. 2. Mild scarring in both kidneys. 3. Colonic diverticulosis.  Vascular surgery has seen the patient and said no immediate need for revascularization at this time.    Past Medical History:  Diagnosis Date   Benign tumor of right femur, s/p removal    Chicken pox    Chronic lower back pain    Essential hypertension 08/03/2018   Former smoker, 100 pack year Hx, quit < 10 years ago    Fracture    left foot, right foot and left arm    Low vitamin D level    Measles    Morbid obesity ([place]) 08/03/2018   Pure hypercholesterolemia 08/03/2018   Controlled on current regimen. No side effects. Will continue and recheck in 4 months at physical.     Past Surgical History:  Procedure Laterality Date   APPENDECTOMY     CATARACT EXTRACTION Right    CESAREAN [place] SURGERY  06/2017   nose  S/P Bone Tumor Surgery       Prior to Admission medications   Medication Sig Start Date End Date Taking? Authorizing Provider  acetaminophen (TYLENOL) 500 MG tablet Take 1,000 mg by mouth every 6 (six) hours as needed for mild pain.   Yes [provider]  amLODipine (NORVASC) 10 MG tablet TAKE 1  TABLET(10 MG) BY MOUTH DAILY Patient taking differently: Take 10 mg by mouth daily. 02/10/21  Yes Orma Flaming, MD  aspirin 81 MG chewable tablet Chew 81 mg by mouth daily.   Yes [provider]  B Complex-C (B-COMPLEX WITH VITAMIN C) tablet Take 1 tablet by mouth daily.   Yes [provider]  cholecalciferol (VITAMIN D) 1000 units tablet Take 2,000 Units by mouth daily.   Yes [provider]  Coenzyme Q10 (CO Q 10 PO) Take 200 mg by mouth.   Yes [provider]  hydrochlorothiazide (HYDRODIURIL) 25 MG tablet TAKE 1 TABLET(25 MG) BY MOUTH DAILY Patient taking differently: Take 25 mg by mouth daily. 02/20/21  Yes Orma Flaming, MD  rosuvastatin (CRESTOR) 5 MG tablet TAKE 1 TABLET(5 MG) BY MOUTH DAILY Patient taking differently: Take 5 mg by mouth daily. 07/07/21  Yes Marin Olp, MD    Current Facility-Administered Medications  Medication Dose Route Frequency Provider Last Rate Last Admin   0.9 % NaCl with KCl 20 mEq/ L  infusion   Intravenous Continuous Reubin Milan, MD 100 mL/hr at 07/25/21 0653 New Bag at 07/25/21 0653   acetaminophen (TYLENOL) tablet 650 mg  650 mg Oral Q6H PRN Reubin Milan, MD   650 mg at 07/25/21 3810   Or   acetaminophen (TYLENOL) suppository 650 mg  650 mg Rectal Q6H PRN Reubin Milan, MD       ondansetron [facility]) tablet 4 mg  4 mg Oral Q6H PRN Reubin Milan, MD       Or   ondansetron [facility]) injection 4 mg  4 mg Intravenous Q6H PRN Reubin Milan, MD       pantoprazole (PROTONIX) injection 40 mg  40 mg Intravenous Q24H Reubin Milan, MD   40 mg at 07/24/21 2002   potassium chloride 10 mEq in 100 mL IVPB  10 mEq Intravenous Once Reubin Milan, MD        Allergies as of 07/24/2021 - Review Complete 07/24/2021  Allergen Reaction Noted   Angiotensin receptor blockers  07/24/2021   Losartan potassium-hctz  08/03/2018   Tetanus toxoids  08/02/2018   Thiazide-type diuretics   07/24/2021   Tramadol Other (See Comments) 08/02/2018    Family History  Problem Relation Age of Onset   Dementia Mother    Mental illness Mother    Hypertension Father    Hyperlipidemia Father    Heart attack Father     Social History   Socioeconomic History   Marital status: Divorced    Spouse name: Not on file   Number of children: Not on file   Years of education: Not on file   Highest education level: High school graduate  Occupational History   Occupation: Accountant  Tobacco Use   Smoking status: Former    Types: Cigarettes    Quit date: 08/03/2011    Years since quitting: 9.9   Smokeless tobacco: Never  Substance and Sexual Activity   Alcohol use: Never   Drug use: Never   Sexual activity: Not Currently    Partners: Male  Other Topics Concern   Not  on file  Social History Narrative   Not on file   Social Determinants of Health   Financial Resource Strain: Not on file  Food Insecurity: Not on file  Transportation Needs: Not on file  Physical Activity: Not on file  Stress: Not on file  Social Connections: Not on file  Intimate Partner Violence: Not on file   Review of Systems: ROS is O/W negative except as mentioned in HPI.  Physical Exam: Vital signs in last 24 hours: Temp:  [97.7 F (36.5 C)-98.7 F (37.1 C)] 98.3 F (36.8 C) (10/21 0415) Pulse Rate:  [66-94] 66 (10/21 0415) Resp:  [13-19] 18 (10/21 0415) BP: (96-175)/(49-98) 135/50 (10/21 0415) SpO2:  [98 %-100 %] 98 % (10/21 0415) Last BM Date:  (pta) General:  Alert, Well-developed, well-nourished, pleasant and cooperative in NAD Head:  Normocephalic and atraumatic. Eyes:  Sclera clear, no icterus.  Conjunctiva pink. Ears:  Normal auditory acuity. Mouth:  No deformity or lesions.   Lungs:  Clear throughout to auscultation.  No wheezes, crackles, or rhonchi.  Heart:  Regular rate and rhythm; no murmurs, clicks, rubs, or gallops. Abdomen:  Soft, non-distended.  BS present.  Lower  abdominal TTP. Msk:  Symmetrical without gross deformities. Pulses:  Normal pulses noted. Extremities:  Without clubbing or edema. Neurologic:  Alert and oriented x 4;  grossly normal neurologically. Skin:  Intact without significant lesions or rashes. Psych:  Alert and cooperative. Normal mood and affect.  Intake/Output from previous day: 10/20 0701 - 10/21 0700 In: 1176.1 [I.V.:976; IV Piggyback:200] Out: -   Lab Results: Recent Labs    07/24/21 1048 07/24/21 1831 07/25/21 0421  WBC 17.5* 15.4* 15.7*  HGB 14.5 14.3 12.9  HCT 43.9 44.3 39.7  PLT 266 257 211   BMET Recent Labs    07/24/21 1048 07/25/21 0421  NA 137 136  K 3.1* 3.3*  CL 100 104  CO2 26 24  GLUCOSE 114* 95  BUN 18 15  CREATININE 0.90 0.86  CALCIUM 9.4 8.5*   LFT Recent Labs    07/24/21 1048  PROT 8.4*  ALBUMIN 4.4  AST 23  ALT 17  ALKPHOS 60  BILITOT 0.7   PT/INR Recent Labs    07/24/21 1831  LABPROT 14.2  INR 1.1   Studies/Results: CT ABDOMEN PELVIS W CONTRAST  Result Date: 07/24/2021 CLINICAL DATA:  Acute abdominal pain. EXAM: CT ABDOMEN AND PELVIS WITH CONTRAST TECHNIQUE: Multidetector CT imaging of the abdomen and pelvis was performed using the standard protocol following bolus administration of intravenous contrast. CONTRAST:  81mL OMNIPAQUE IOHEXOL 350 MG/ML SOLN COMPARISON:  None. FINDINGS: Lower chest: Lung bases are clear. Hepatobiliary: No focal hepatic lesion. No biliary duct dilatation. Common bile duct is normal. Pancreas: Pancreas is normal. No ductal dilatation. No pancreatic inflammation. Spleen: Normal spleen Adrenals/urinary tract: Adrenal glands and kidneys are normal. The ureters and bladder normal. Stomach/Bowel: Stomach, small-bowel and cecum are normal. The appendix is not identified but there is no pericecal inflammation to suggest appendicitis. Ascending and transverse colon normal. Beginning in the distal aspect of the transverse colon and continuing through the  splenic flexure of the colon and descending colon, there is diffuse submucosal edema with luminal narrowing throughout this section of bowel. There is mild pericolonic fat stranding from the splenic flexure through the RIGHT pericolic gutter. There is no pneumatosis or portal venous gas. There are several diverticula the descending colon and sigmoid colon however the above colonic process appears more diffuse than focal diverticulitis. There  is significant intimal calcification in the aorta and at the ostia the SMA and celiac trunk. These vessels are grossly opacified on portal venous phase imaging. The SMA and celiac trunk share a common origin. Calcifications in the proximal IMA. Diverticula sigmoid colon.  Rectum normal Vascular/Lymphatic: Abdominal aorta is normal caliber with atherosclerotic calcification. See GI section for description mesenteric arteries. There is no retroperitoneal or periportal lymphadenopathy. No pelvic lymphadenopathy. Reproductive: Uterus and adnexa unremarkable. Other: No free fluid or intraperitoneal free air. Musculoskeletal: No aggressive osseous lesion. IMPRESSION: 1. Colonic submucosal edema and mild pericolonic inflammation of the LEFT colon from the splenic flexure to the sigmoid colon is most consistent with segmental colitis. Differential would include ischemic colitis, infectious colitis, drug induced colitis, or inflammatory bowel disease. Watershed vascular distribution could favor ischemic colitis. No pneumatosis or portal venous gas. Intimal and ostial calcifications of the aorta and SMA respectively. 2. Sigmoid colon descending colon diverticulosis without clear evidence of diverticulitis. Findings conveyed toCHRISTOPHER TEGELER on 07/24/2021  at12:33. Electronically Signed   By: Suzy Bouchard M.D.   On: 07/24/2021 12:37   CT Angio Abd/Pel w/ and/or w/o  Result Date: 07/24/2021 CLINICAL DATA:  Evaluate colitis. EXAM: CTA ABDOMEN AND PELVIS WITHOUT AND WITH  CONTRAST TECHNIQUE: Multidetector CT imaging of the abdomen and pelvis was performed using the standard protocol during bolus administration of intravenous contrast. Multiplanar reconstructed images and MIPs were obtained and reviewed to evaluate the vascular anatomy. CONTRAST:  136mL OMNIPAQUE IOHEXOL 350 MG/ML SOLN COMPARISON:  CT abdomen and pelvis 07/24/2020 FINDINGS: VASCULAR Aorta: Extensive atherosclerotic disease in the abdominal aorta. Proximal abdominal aorta measures up to 2.9 cm. Infrarenal abdominal aorta measures 3.2 cm. Scattered areas of mural thrombus throughout the abdominal aorta. 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 Left gastric artery, splenic artery and common hepatic artery patent. SMA: Origin of the SMA is very close to the origin the celiac trunk and there appears to be high-grade stenosis, greater than 50% at the origin. Diffuse atherosclerotic disease in the SMA. Renals: 2 right renal arteries with at least moderate stenosis involving the right superior renal artery. Mixed plaque at the origin of the left renal artery but difficult to quantify the degree of stenosis. No evidence for renal artery aneurysm. IMA: Patent with calcified plaque. There is arterial flow to the left lower colic and sigmoid branches at the area of inflammation. Inflow: Atherosclerotic calcifications in the common, internal and external iliac arteries. No significant stenosis involving the common or external iliac arteries. Proximal Outflow: Proximal femoral arteries are patent bilaterally Veins: No obvious venous abnormality within the limitations of this arterial phase study. Review of the MIP images confirms the above findings. NON-VASCULAR Lower chest: Lung bases are clear. Hepatobiliary: Normal appearance of the liver and gallbladder. Pancreas: Unremarkable. No pancreatic ductal dilatation or surrounding inflammatory changes. Spleen: Normal in size without focal abnormality.  Adrenals/Urinary Tract: Normal adrenal glands. Evidence for cortical scarring in both kidneys. No hydronephrosis. Normal urinary bladder. Stomach/Bowel: Again noted is pericolonic stranding throughout the descending colon. Scattered colonic diverticula. No evidence for bowel distention or obstruction. Normal appearance of the stomach. Lymphatic: No abdominal or pelvic lymph node enlargement. Reproductive: Uterus and bilateral adnexa are unremarkable. Other: Negative for ascites. Negative for free air. 5 mm low-density nodule in the anterior left lower abdomen on sequence 4 image 142 is nonspecific. No other significant nodularity in the omentum or peritoneal cavity. Musculoskeletal: Grade 2 anterolisthesis of L4 on L5 secondary to facet arthropathy.  Disc space narrowing at L4-L5 and L5-S1. IMPRESSION: VASCULAR 1. Diffuse atherosclerotic disease throughout the abdomen and pelvis. 2. 3.2 cm infrarenal abdominal aortic aneurysm. Recommend follow-up every 3 years. Reference: J Am Coll Radiol 2902;11:155-208. 3. Origin of the celiac trunk and SMA are close to each other and concern for high-grade stenosis involving both of these vessels. No evidence for acute thrombus. However, this pattern disease could be associated with chronic mesenteric ischemia. 4. IMA is patent. There is evidence for blood flow to the left colic and sigmoid branches associated with the area of colonic inflammation. No evidence for acute thromboembolic disease in this area. 5. Probable bilateral renal artery stenosis as described. NON-VASCULAR 1. Persistent inflammatory changes involving the descending colon and findings are similar to the recent comparison examination. Etiology for the colitis remains indeterminate. 2. Mild scarring in both kidneys. 3. Colonic diverticulosis. Electronically Signed   By: Markus Daft M.D.   On: 07/24/2021 14:19    IMPRESSION:  *70 year old female with rectal bleeding and crampy lower abdominal pain that has been  progressive over the past 4-5 days, colitis seen on CT scan, ? Ischemic with some atherosclerosis and stenosis seen in the abdominal vasculature.  Other differential includes infectious although she has not had diarrhea, IBD but has no chronic symptoms, and malignancy.  Hgb normal.  PLAN: -Clear liquids today and colonoscopy with Dr. Havery Moros on 10/22. -Monitor labs, CBC, etc. -Supportive care otherwise for now.   Laban Emperor. Elie Gragert  07/25/2021, 9:05 AM

## 2021-07-25 NOTE — H&P (View-Only) (Signed)
Referring Provider:  [facility] Primary Care Physician:  Pcp, No Primary Gastroenterologist:  Althia Forts  Reason for Consultation:  Colitis, rectal bleeding  HPI: Sonya Woods is a 70 y.o. female with medical history significant of benign right femur tumor, varicella-zoster, chronic lower back pain, hypertension, history of cigarette smoking, bilateral feet and left arm fractures, vitamin D deficiency, measles, morbid obesity, pure hypercholesterolemia who presented to the ER at [facility] with complaints of rectal bleeding and crampy lower abdominal pain that has been progressive over the past 4-5 days.  She says that when it started she thought that she had to have a BM but only passed red blood.  She thought it would resolve, but it continued to occur and worsened over a few days.  She also has been having lower abdominal pain.  Prior to this she was having regular bowel movements ever day.  Says that she had a colonoscopy 15 years ago in [place].  She denies NSAID use, no blood thinners.  She denies any heartburn/reflux, dysphagia, weight loss, etc.  Hgb is normal.  BUN is normal.  WBC count elevated at 17.5 but down to 15.7 today.  CT of the abdomen and pelvis with contrast:  IMPRESSION: 1. Colonic submucosal edema and mild pericolonic inflammation of the LEFT colon from the splenic flexure to the sigmoid colon is most consistent with segmental colitis. Differential would include ischemic colitis, infectious colitis, drug induced colitis, or inflammatory bowel disease. Watershed vascular distribution could favor ischemic colitis. No pneumatosis or portal venous gas. Intimal and ostial calcifications of the aorta and SMA respectively. 2. Sigmoid colon descending colon diverticulosis without clear evidence of diverticulitis.  CT angio of the abdomen and pelvis:  IMPRESSION: VASCULAR   1. Diffuse atherosclerotic disease throughout the abdomen and pelvis. 2. 3.2 cm infrarenal abdominal aortic  aneurysm. Recommend follow-up every 3 years. Reference: J Am Coll Radiol 4010;27:253-664. 3. Origin of the celiac trunk and SMA are close to each other and concern for high-grade stenosis involving both of these vessels. No evidence for acute thrombus. However, this pattern disease could be associated with chronic mesenteric ischemia. 4. IMA is patent. There is evidence for blood flow to the left colic and sigmoid branches associated with the area of colonic inflammation. No evidence for acute thromboembolic disease in this area. 5. Probable bilateral renal artery stenosis as described.   NON-VASCULAR   1. Persistent inflammatory changes involving the descending colon and findings are similar to the recent comparison examination. Etiology for the colitis remains indeterminate. 2. Mild scarring in both kidneys. 3. Colonic diverticulosis.  Vascular surgery has seen the patient and said no immediate need for revascularization at this time.    Past Medical History:  Diagnosis Date   Benign tumor of right femur, s/p removal    Chicken pox    Chronic lower back pain    Essential hypertension 08/03/2018   Former smoker, 100 pack year Hx, quit < 10 years ago    Fracture    left foot, right foot and left arm    Low vitamin D level    Measles    Morbid obesity ([place]) 08/03/2018   Pure hypercholesterolemia 08/03/2018   Controlled on current regimen. No side effects. Will continue and recheck in 4 months at physical.     Past Surgical History:  Procedure Laterality Date   APPENDECTOMY     CATARACT EXTRACTION Right    CESAREAN [place] SURGERY  06/2017   nose  S/P Bone Tumor Surgery       Prior to Admission medications   Medication Sig Start Date End Date Taking? Authorizing Provider  acetaminophen (TYLENOL) 500 MG tablet Take 1,000 mg by mouth every 6 (six) hours as needed for mild pain.   Yes [provider]  amLODipine (NORVASC) 10 MG tablet TAKE 1  TABLET(10 MG) BY MOUTH DAILY Patient taking differently: Take 10 mg by mouth daily. 02/10/21  Yes Orma Flaming, MD  aspirin 81 MG chewable tablet Chew 81 mg by mouth daily.   Yes [provider]  B Complex-C (B-COMPLEX WITH VITAMIN C) tablet Take 1 tablet by mouth daily.   Yes [provider]  cholecalciferol (VITAMIN D) 1000 units tablet Take 2,000 Units by mouth daily.   Yes [provider]  Coenzyme Q10 (CO Q 10 PO) Take 200 mg by mouth.   Yes [provider]  hydrochlorothiazide (HYDRODIURIL) 25 MG tablet TAKE 1 TABLET(25 MG) BY MOUTH DAILY Patient taking differently: Take 25 mg by mouth daily. 02/20/21  Yes Orma Flaming, MD  rosuvastatin (CRESTOR) 5 MG tablet TAKE 1 TABLET(5 MG) BY MOUTH DAILY Patient taking differently: Take 5 mg by mouth daily. 07/07/21  Yes Marin Olp, MD    Current Facility-Administered Medications  Medication Dose Route Frequency Provider Last Rate Last Admin   0.9 % NaCl with KCl 20 mEq/ L  infusion   Intravenous Continuous Reubin Milan, MD 100 mL/hr at 07/25/21 0653 New Bag at 07/25/21 0653   acetaminophen (TYLENOL) tablet 650 mg  650 mg Oral Q6H PRN Reubin Milan, MD   650 mg at 07/25/21 7371   Or   acetaminophen (TYLENOL) suppository 650 mg  650 mg Rectal Q6H PRN Reubin Milan, MD       ondansetron [facility]) tablet 4 mg  4 mg Oral Q6H PRN Reubin Milan, MD       Or   ondansetron [facility]) injection 4 mg  4 mg Intravenous Q6H PRN Reubin Milan, MD       pantoprazole (PROTONIX) injection 40 mg  40 mg Intravenous Q24H Reubin Milan, MD   40 mg at 07/24/21 2002   potassium chloride 10 mEq in 100 mL IVPB  10 mEq Intravenous Once Reubin Milan, MD        Allergies as of 07/24/2021 - Review Complete 07/24/2021  Allergen Reaction Noted   Angiotensin receptor blockers  07/24/2021   Losartan potassium-hctz  08/03/2018   Tetanus toxoids  08/02/2018   Thiazide-type diuretics   07/24/2021   Tramadol Other (See Comments) 08/02/2018    Family History  Problem Relation Age of Onset   Dementia Mother    Mental illness Mother    Hypertension Father    Hyperlipidemia Father    Heart attack Father     Social History   Socioeconomic History   Marital status: Divorced    Spouse name: Not on file   Number of children: Not on file   Years of education: Not on file   Highest education level: High school graduate  Occupational History   Occupation: Accountant  Tobacco Use   Smoking status: Former    Types: Cigarettes    Quit date: 08/03/2011    Years since quitting: 9.9   Smokeless tobacco: Never  Substance and Sexual Activity   Alcohol use: Never   Drug use: Never   Sexual activity: Not Currently    Partners: Male  Other Topics Concern   Not  on file  Social History Narrative   Not on file   Social Determinants of Health   Financial Resource Strain: Not on file  Food Insecurity: Not on file  Transportation Needs: Not on file  Physical Activity: Not on file  Stress: Not on file  Social Connections: Not on file  Intimate Partner Violence: Not on file   Review of Systems: ROS is O/W negative except as mentioned in HPI.  Physical Exam: Vital signs in last 24 hours: Temp:  [97.7 F (36.5 C)-98.7 F (37.1 C)] 98.3 F (36.8 C) (10/21 0415) Pulse Rate:  [66-94] 66 (10/21 0415) Resp:  [13-19] 18 (10/21 0415) BP: (96-175)/(49-98) 135/50 (10/21 0415) SpO2:  [98 %-100 %] 98 % (10/21 0415) Last BM Date:  (pta) General:  Alert, Well-developed, well-nourished, pleasant and cooperative in NAD Head:  Normocephalic and atraumatic. Eyes:  Sclera clear, no icterus.  Conjunctiva pink. Ears:  Normal auditory acuity. Mouth:  No deformity or lesions.   Lungs:  Clear throughout to auscultation.  No wheezes, crackles, or rhonchi.  Heart:  Regular rate and rhythm; no murmurs, clicks, rubs, or gallops. Abdomen:  Soft, non-distended.  BS present.  Lower  abdominal TTP. Msk:  Symmetrical without gross deformities. Pulses:  Normal pulses noted. Extremities:  Without clubbing or edema. Neurologic:  Alert and oriented x 4;  grossly normal neurologically. Skin:  Intact without significant lesions or rashes. Psych:  Alert and cooperative. Normal mood and affect.  Intake/Output from previous day: 10/20 0701 - 10/21 0700 In: 1176.1 [I.V.:976; IV Piggyback:200] Out: -   Lab Results: Recent Labs    07/24/21 1048 07/24/21 1831 07/25/21 0421  WBC 17.5* 15.4* 15.7*  HGB 14.5 14.3 12.9  HCT 43.9 44.3 39.7  PLT 266 257 211   BMET Recent Labs    07/24/21 1048 07/25/21 0421  NA 137 136  K 3.1* 3.3*  CL 100 104  CO2 26 24  GLUCOSE 114* 95  BUN 18 15  CREATININE 0.90 0.86  CALCIUM 9.4 8.5*   LFT Recent Labs    07/24/21 1048  PROT 8.4*  ALBUMIN 4.4  AST 23  ALT 17  ALKPHOS 60  BILITOT 0.7   PT/INR Recent Labs    07/24/21 1831  LABPROT 14.2  INR 1.1   Studies/Results: CT ABDOMEN PELVIS W CONTRAST  Result Date: 07/24/2021 CLINICAL DATA:  Acute abdominal pain. EXAM: CT ABDOMEN AND PELVIS WITH CONTRAST TECHNIQUE: Multidetector CT imaging of the abdomen and pelvis was performed using the standard protocol following bolus administration of intravenous contrast. CONTRAST:  57mL OMNIPAQUE IOHEXOL 350 MG/ML SOLN COMPARISON:  None. FINDINGS: Lower chest: Lung bases are clear. Hepatobiliary: No focal hepatic lesion. No biliary duct dilatation. Common bile duct is normal. Pancreas: Pancreas is normal. No ductal dilatation. No pancreatic inflammation. Spleen: Normal spleen Adrenals/urinary tract: Adrenal glands and kidneys are normal. The ureters and bladder normal. Stomach/Bowel: Stomach, small-bowel and cecum are normal. The appendix is not identified but there is no pericecal inflammation to suggest appendicitis. Ascending and transverse colon normal. Beginning in the distal aspect of the transverse colon and continuing through the  splenic flexure of the colon and descending colon, there is diffuse submucosal edema with luminal narrowing throughout this section of bowel. There is mild pericolonic fat stranding from the splenic flexure through the RIGHT pericolic gutter. There is no pneumatosis or portal venous gas. There are several diverticula the descending colon and sigmoid colon however the above colonic process appears more diffuse than focal diverticulitis. There  is significant intimal calcification in the aorta and at the ostia the SMA and celiac trunk. These vessels are grossly opacified on portal venous phase imaging. The SMA and celiac trunk share a common origin. Calcifications in the proximal IMA. Diverticula sigmoid colon.  Rectum normal Vascular/Lymphatic: Abdominal aorta is normal caliber with atherosclerotic calcification. See GI section for description mesenteric arteries. There is no retroperitoneal or periportal lymphadenopathy. No pelvic lymphadenopathy. Reproductive: Uterus and adnexa unremarkable. Other: No free fluid or intraperitoneal free air. Musculoskeletal: No aggressive osseous lesion. IMPRESSION: 1. Colonic submucosal edema and mild pericolonic inflammation of the LEFT colon from the splenic flexure to the sigmoid colon is most consistent with segmental colitis. Differential would include ischemic colitis, infectious colitis, drug induced colitis, or inflammatory bowel disease. Watershed vascular distribution could favor ischemic colitis. No pneumatosis or portal venous gas. Intimal and ostial calcifications of the aorta and SMA respectively. 2. Sigmoid colon descending colon diverticulosis without clear evidence of diverticulitis. Findings conveyed toCHRISTOPHER TEGELER on 07/24/2021  at12:33. Electronically Signed   By: Suzy Bouchard M.D.   On: 07/24/2021 12:37   CT Angio Abd/Pel w/ and/or w/o  Result Date: 07/24/2021 CLINICAL DATA:  Evaluate colitis. EXAM: CTA ABDOMEN AND PELVIS WITHOUT AND WITH  CONTRAST TECHNIQUE: Multidetector CT imaging of the abdomen and pelvis was performed using the standard protocol during bolus administration of intravenous contrast. Multiplanar reconstructed images and MIPs were obtained and reviewed to evaluate the vascular anatomy. CONTRAST:  138mL OMNIPAQUE IOHEXOL 350 MG/ML SOLN COMPARISON:  CT abdomen and pelvis 07/24/2020 FINDINGS: VASCULAR Aorta: Extensive atherosclerotic disease in the abdominal aorta. Proximal abdominal aorta measures up to 2.9 cm. Infrarenal abdominal aorta measures 3.2 cm. Scattered areas of mural thrombus throughout the abdominal aorta. 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 Left gastric artery, splenic artery and common hepatic artery patent. SMA: Origin of the SMA is very close to the origin the celiac trunk and there appears to be high-grade stenosis, greater than 50% at the origin. Diffuse atherosclerotic disease in the SMA. Renals: 2 right renal arteries with at least moderate stenosis involving the right superior renal artery. Mixed plaque at the origin of the left renal artery but difficult to quantify the degree of stenosis. No evidence for renal artery aneurysm. IMA: Patent with calcified plaque. There is arterial flow to the left lower colic and sigmoid branches at the area of inflammation. Inflow: Atherosclerotic calcifications in the common, internal and external iliac arteries. No significant stenosis involving the common or external iliac arteries. Proximal Outflow: Proximal femoral arteries are patent bilaterally Veins: No obvious venous abnormality within the limitations of this arterial phase study. Review of the MIP images confirms the above findings. NON-VASCULAR Lower chest: Lung bases are clear. Hepatobiliary: Normal appearance of the liver and gallbladder. Pancreas: Unremarkable. No pancreatic ductal dilatation or surrounding inflammatory changes. Spleen: Normal in size without focal abnormality.  Adrenals/Urinary Tract: Normal adrenal glands. Evidence for cortical scarring in both kidneys. No hydronephrosis. Normal urinary bladder. Stomach/Bowel: Again noted is pericolonic stranding throughout the descending colon. Scattered colonic diverticula. No evidence for bowel distention or obstruction. Normal appearance of the stomach. Lymphatic: No abdominal or pelvic lymph node enlargement. Reproductive: Uterus and bilateral adnexa are unremarkable. Other: Negative for ascites. Negative for free air. 5 mm low-density nodule in the anterior left lower abdomen on sequence 4 image 142 is nonspecific. No other significant nodularity in the omentum or peritoneal cavity. Musculoskeletal: Grade 2 anterolisthesis of L4 on L5 secondary to facet arthropathy.  Disc space narrowing at L4-L5 and L5-S1. IMPRESSION: VASCULAR 1. Diffuse atherosclerotic disease throughout the abdomen and pelvis. 2. 3.2 cm infrarenal abdominal aortic aneurysm. Recommend follow-up every 3 years. Reference: J Am Coll Radiol 7341;93:790-240. 3. Origin of the celiac trunk and SMA are close to each other and concern for high-grade stenosis involving both of these vessels. No evidence for acute thrombus. However, this pattern disease could be associated with chronic mesenteric ischemia. 4. IMA is patent. There is evidence for blood flow to the left colic and sigmoid branches associated with the area of colonic inflammation. No evidence for acute thromboembolic disease in this area. 5. Probable bilateral renal artery stenosis as described. NON-VASCULAR 1. Persistent inflammatory changes involving the descending colon and findings are similar to the recent comparison examination. Etiology for the colitis remains indeterminate. 2. Mild scarring in both kidneys. 3. Colonic diverticulosis. Electronically Signed   By: Markus Daft M.D.   On: 07/24/2021 14:19    IMPRESSION:  *70 year old female with rectal bleeding and crampy lower abdominal pain that has been  progressive over the past 4-5 days, colitis seen on CT scan, ? Ischemic with some atherosclerosis and stenosis seen in the abdominal vasculature.  Other differential includes infectious although she has not had diarrhea, IBD but has no chronic symptoms, and malignancy.  Hgb normal.  PLAN: -Clear liquids today and colonoscopy with Dr. Havery Moros on 10/22. -Monitor labs, CBC, etc. -Supportive care otherwise for now.   Laban Emperor. Khiana Camino  07/25/2021, 9:05 AM

## 2021-07-25 NOTE — Progress Notes (Signed)
PROGRESS NOTE    Sonya Woods  WCH:[id number] DOB: 11-Feb-1951 DOA: 07/24/2021 PCP: Pcp, No   Brief Narrative:  Sonya Woods is a 70 y.o. female with medical history significant of benign right femur tumor, varicella-zoster, chronic lower back pain, hypertension, history of cigarette smoking, bilateral feet and left arm fractures, vitamin D deficiency, measles, morbid obesity, pure hypercholesterolemia who is coming to the emergency department with complaints of a having multiple episodes of painful rectal bleeding with bright red blood for the past 3 days; off and on bleeding over the past 2 weeks but nothing 'substantial' until the last few days. Fecal occult blood was positive, GI consulted for further recommendations - imaging shows questionable colitis.  Ischemic colitis is a possibility. CTA abdomen showed diffuse atherosclerotic disease throughout the abdomen and pelvis. There was a 3.2 cm infrarenal AAA there was evidence of blood flow to the left colic and sigmoid branches associated with the area of colonic inflammation.  Please see images and full radiology report for further details.  Assessment & Plan:   Painful rectal bleeding Questionable ischemic colitis vs infectious vs other Continue holding antibiotics unless procalcitonin positive(currently <10) or worsening abdominal symptoms GI to follow  Antiemetics/analgesics as needed. Vascular surgery has been consulted - no indication for revascularization - follow up in 4 weeks outpatient as indicated [facility] following - possible lower scope in the next 24-48h pending further evaluation  Essential hypertension Mildly hypotensive - restart home amlodipine as indicated   Pure hypercholesterolemia Currently on Crestor 5 mg p.o. daily   Prediabetes Currently NPO Check CBG every 6 hours   Hypokalemia Continue K supplementation Follow-up potassium level  DVT prophylaxis: SCDs. Code Status: Full code. Family  Communication: None  Status is: inpt  Dispo: The patient is from: Home              Anticipated d/c is to: TBD              Anticipated d/c date is: 48-72              Patient currently NOT medically stable for discharge  Consultants:  GI - [facility]  Procedures:  Colonoscpy, pending above  Antimicrobials:  None   Subjective: No acute issues or events overnight denies nausea vomiting diarrhea constipation headache fevers chills or chest pain.  Abdominal pain resolved, no GI bleed or bowel movement since admission  Objective: Vitals:   07/24/21 1625 07/24/21 1836 07/24/21 2003 07/25/21 0415  BP: 96/69 (!) 144/58 (!) 141/54 (!) 135/50  Pulse: 92 77 75 66  Resp: 16 18 18 18   Temp:  98.7 F (37.1 C) 98.5 F (36.9 C) 98.3 F (36.8 C)  TempSrc:   Oral Oral  SpO2: 98% 100% 100% 98%    Intake/Output Summary (Last 24 hours) at 07/25/2021 0724 Last data filed at 07/25/2021 3536 Gross per 24 hour  Intake 1176.05 ml  Output --  Net 1176.05 ml   There were no vitals filed for this visit.  Examination:  General exam: Appears calm and comfortable  Respiratory system: Clear to auscultation. Respiratory effort normal. Cardiovascular system: S1 & S2 heard, RRR. No JVD, murmurs, rubs, gallops or clicks. No pedal edema. Gastrointestinal system: Abdomen is nondistended, soft and nontender. No organomegaly or masses felt. Normal bowel sounds heard. Central nervous system: Alert and oriented. No focal neurological deficits. Extremities: Symmetric 5 x 5 power. Skin: No rashes, lesions or ulcers Psychiatry: Judgement and insight appear normal. Mood & affect appropriate.   Data  Reviewed: I have personally reviewed following labs and imaging studies  CBC: Recent Labs  Lab 07/24/21 1048 07/24/21 1831 07/25/21 0421  WBC 17.5* 15.4* 15.7*  HGB 14.5 14.3 12.9  HCT 43.9 44.3 39.7  MCV 86.6 88.1 88.2  PLT 266 257 712   Basic Metabolic Panel: Recent Labs  Lab 07/24/21 1048  07/25/21 0421  NA 137 136  K 3.1* 3.3*  CL 100 104  CO2 26 24  GLUCOSE 114* 95  BUN 18 15  CREATININE 0.90 0.86  CALCIUM 9.4 8.5*  MG 2.0  --    GFR: CrCl cannot be calculated (Unknown ideal weight.). Liver Function Tests: Recent Labs  Lab 07/24/21 1048  AST 23  ALT 17  ALKPHOS 60  BILITOT 0.7  PROT 8.4*  ALBUMIN 4.4   No results for input(s): LIPASE, AMYLASE in the last 168 hours. No results for input(s): AMMONIA in the last 168 hours. Coagulation Profile: Recent Labs  Lab 07/24/21 1831  INR 1.1   Cardiac Enzymes: No results for input(s): CKTOTAL, CKMB, CKMBINDEX, TROPONINI in the last 168 hours. BNP (last 3 results) No results for input(s): PROBNP in the last 8760 hours. HbA1C: No results for input(s): HGBA1C in the last 72 hours. CBG: Recent Labs  Lab 07/24/21 2008 07/25/21 0411  GLUCAP 104* 97   Lipid Profile: No results for input(s): CHOL, HDL, LDLCALC, TRIG, CHOLHDL, LDLDIRECT in the last 72 hours. Thyroid Function Tests: No results for input(s): TSH, T4TOTAL, FREET4, T3FREE, THYROIDAB in the last 72 hours. Anemia Panel: No results for input(s): VITAMINB12, FOLATE, FERRITIN, TIBC, IRON, RETICCTPCT in the last 72 hours. Sepsis Labs: Recent Labs  Lab 07/24/21 1104 07/24/21 1304  LATICACIDVEN 1.5 1.2    No results found for this or any previous visit (from the past 240 hour(s)).    Radiology Studies: CT ABDOMEN PELVIS W CONTRAST  Result Date: 07/24/2021 CLINICAL DATA:  Acute abdominal pain. EXAM: CT ABDOMEN AND PELVIS WITH CONTRAST TECHNIQUE: Multidetector CT imaging of the abdomen and pelvis was performed using the standard protocol following bolus administration of intravenous contrast. CONTRAST:  86mL OMNIPAQUE IOHEXOL 350 MG/ML SOLN COMPARISON:  None. FINDINGS: Lower chest: Lung bases are clear. Hepatobiliary: No focal hepatic lesion. No biliary duct dilatation. Common bile duct is normal. Pancreas: Pancreas is normal. No ductal dilatation. No  pancreatic inflammation. Spleen: Normal spleen Adrenals/urinary tract: Adrenal glands and kidneys are normal. The ureters and bladder normal. Stomach/Bowel: Stomach, small-bowel and cecum are normal. The appendix is not identified but there is no pericecal inflammation to suggest appendicitis. Ascending and transverse colon normal. Beginning in the distal aspect of the transverse colon and continuing through the splenic flexure of the colon and descending colon, there is diffuse submucosal edema with luminal narrowing throughout this section of bowel. There is mild pericolonic fat stranding from the splenic flexure through the RIGHT pericolic gutter. There is no pneumatosis or portal venous gas. There are several diverticula the descending colon and sigmoid colon however the above colonic process appears more diffuse than focal diverticulitis. There is significant intimal calcification in the aorta and at the ostia the SMA and celiac trunk. These vessels are grossly opacified on portal venous phase imaging. The SMA and celiac trunk share a common origin. Calcifications in the proximal IMA. Diverticula sigmoid colon.  Rectum normal Vascular/Lymphatic: Abdominal aorta is normal caliber with atherosclerotic calcification. See GI section for description mesenteric arteries. There is no retroperitoneal or periportal lymphadenopathy. No pelvic lymphadenopathy. Reproductive: Uterus and adnexa unremarkable. Other: No  free fluid or intraperitoneal free air. Musculoskeletal: No aggressive osseous lesion. IMPRESSION: 1. Colonic submucosal edema and mild pericolonic inflammation of the LEFT colon from the splenic flexure to the sigmoid colon is most consistent with segmental colitis. Differential would include ischemic colitis, infectious colitis, drug induced colitis, or inflammatory bowel disease. Watershed vascular distribution could favor ischemic colitis. No pneumatosis or portal venous gas. Intimal and ostial  calcifications of the aorta and SMA respectively. 2. Sigmoid colon descending colon diverticulosis without clear evidence of diverticulitis. Findings conveyed toCHRISTOPHER TEGELER on 07/24/2021  at12:33. Electronically Signed   By: Suzy Bouchard M.D.   On: 07/24/2021 12:37   CT Angio Abd/Pel w/ and/or w/o  Result Date: 07/24/2021 CLINICAL DATA:  Evaluate colitis. EXAM: CTA ABDOMEN AND PELVIS WITHOUT AND WITH CONTRAST TECHNIQUE: Multidetector CT imaging of the abdomen and pelvis was performed using the standard protocol during bolus administration of intravenous contrast. Multiplanar reconstructed images and MIPs were obtained and reviewed to evaluate the vascular anatomy. CONTRAST:  126mL OMNIPAQUE IOHEXOL 350 MG/ML SOLN COMPARISON:  CT abdomen and pelvis 07/24/2020 FINDINGS: VASCULAR Aorta: Extensive atherosclerotic disease in the abdominal aorta. Proximal abdominal aorta measures up to 2.9 cm. Infrarenal abdominal aorta measures 3.2 cm. Scattered areas of mural thrombus throughout the abdominal aorta. 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 Left gastric artery, splenic artery and common hepatic artery patent. SMA: Origin of the SMA is very close to the origin the celiac trunk and there appears to be high-grade stenosis, greater than 50% at the origin. Diffuse atherosclerotic disease in the SMA. Renals: 2 right renal arteries with at least moderate stenosis involving the right superior renal artery. Mixed plaque at the origin of the left renal artery but difficult to quantify the degree of stenosis. No evidence for renal artery aneurysm. IMA: Patent with calcified plaque. There is arterial flow to the left lower colic and sigmoid branches at the area of inflammation. Inflow: Atherosclerotic calcifications in the common, internal and external iliac arteries. No significant stenosis involving the common or external iliac arteries. Proximal Outflow: Proximal femoral arteries are patent  bilaterally Veins: No obvious venous abnormality within the limitations of this arterial phase study. Review of the MIP images confirms the above findings. NON-VASCULAR Lower chest: Lung bases are clear. Hepatobiliary: Normal appearance of the liver and gallbladder. Pancreas: Unremarkable. No pancreatic ductal dilatation or surrounding inflammatory changes. Spleen: Normal in size without focal abnormality. Adrenals/Urinary Tract: Normal adrenal glands. Evidence for cortical scarring in both kidneys. No hydronephrosis. Normal urinary bladder. Stomach/Bowel: Again noted is pericolonic stranding throughout the descending colon. Scattered colonic diverticula. No evidence for bowel distention or obstruction. Normal appearance of the stomach. Lymphatic: No abdominal or pelvic lymph node enlargement. Reproductive: Uterus and bilateral adnexa are unremarkable. Other: Negative for ascites. Negative for free air. 5 mm low-density nodule in the anterior left lower abdomen on sequence 4 image 142 is nonspecific. No other significant nodularity in the omentum or peritoneal cavity. Musculoskeletal: Grade 2 anterolisthesis of L4 on L5 secondary to facet arthropathy. Disc space narrowing at L4-L5 and L5-S1. IMPRESSION: VASCULAR 1. Diffuse atherosclerotic disease throughout the abdomen and pelvis. 2. 3.2 cm infrarenal abdominal aortic aneurysm. Recommend follow-up every 3 years. Reference: J Am Coll Radiol 3300;76:226-333. 3. Origin of the celiac trunk and SMA are close to each other and concern for high-grade stenosis involving both of these vessels. No evidence for acute thrombus. However, this pattern disease could be associated with chronic mesenteric ischemia. 4. IMA is patent.  There is evidence for blood flow to the left colic and sigmoid branches associated with the area of colonic inflammation. No evidence for acute thromboembolic disease in this area. 5. Probable bilateral renal artery stenosis as described. NON-VASCULAR 1.  Persistent inflammatory changes involving the descending colon and findings are similar to the recent comparison examination. Etiology for the colitis remains indeterminate. 2. Mild scarring in both kidneys. 3. Colonic diverticulosis. Electronically Signed   By: Markus Daft M.D.   On: 07/24/2021 14:19    Scheduled Meds:  pantoprazole (PROTONIX) IV  40 mg Intravenous Q24H   Continuous Infusions:  0.9 % NaCl with KCl 20 mEq / L 100 mL/hr at 07/25/21 0653     LOS: 0 days   Time spent: 68min  Sonya Chapdelaine C Merlyn Conley, DO [facility]  If 7PM-7AM, please contact night-coverage [email or web address]  07/25/2021, 7:24 AM

## 2021-07-26 ENCOUNTER — Encounter (HOSPITAL_COMMUNITY)
Admission: EM | Disposition: A | Discharge status: Home / Self Care | Payer: Self-pay | Source: Home / Self Care | Attending: Emergency Medicine

## 2021-07-26 ENCOUNTER — Observation Stay (HOSPITAL_COMMUNITY): Payer: Medicare Other | Admitting: Certified Registered Nurse Anesthetist

## 2021-07-26 ENCOUNTER — Encounter (HOSPITAL_COMMUNITY): Payer: Self-pay | Admitting: *Deleted

## 2021-07-26 DIAGNOSIS — Z20822 Contact with and (suspected) exposure to covid-19: Secondary | ICD-10-CM | POA: Diagnosis not present

## 2021-07-26 DIAGNOSIS — K5730 Diverticulosis of large intestine without perforation or abscess without bleeding: Secondary | ICD-10-CM | POA: Diagnosis not present

## 2021-07-26 DIAGNOSIS — R933 Abnormal findings on diagnostic imaging of other parts of digestive tract: Secondary | ICD-10-CM | POA: Diagnosis not present

## 2021-07-26 DIAGNOSIS — I10 Essential (primary) hypertension: Secondary | ICD-10-CM | POA: Diagnosis not present

## 2021-07-26 DIAGNOSIS — E876 Hypokalemia: Secondary | ICD-10-CM | POA: Diagnosis not present

## 2021-07-26 DIAGNOSIS — Z7982 Long term (current) use of aspirin: Secondary | ICD-10-CM | POA: Diagnosis not present

## 2021-07-26 DIAGNOSIS — K55049 Acute infarction of large intestine, extent unspecified: Secondary | ICD-10-CM | POA: Diagnosis not present

## 2021-07-26 DIAGNOSIS — K5731 Diverticulosis of large intestine without perforation or abscess with bleeding: Secondary | ICD-10-CM | POA: Diagnosis not present

## 2021-07-26 DIAGNOSIS — K559 Vascular disorder of intestine, unspecified: Secondary | ICD-10-CM

## 2021-07-26 DIAGNOSIS — R7303 Prediabetes: Secondary | ICD-10-CM | POA: Diagnosis not present

## 2021-07-26 DIAGNOSIS — K529 Noninfective gastroenteritis and colitis, unspecified: Secondary | ICD-10-CM | POA: Diagnosis not present

## 2021-07-26 DIAGNOSIS — K55039 Acute (reversible) ischemia of large intestine, extent unspecified: Secondary | ICD-10-CM | POA: Diagnosis not present

## 2021-07-26 DIAGNOSIS — K633 Ulcer of intestine: Secondary | ICD-10-CM | POA: Diagnosis not present

## 2021-07-26 DIAGNOSIS — K625 Hemorrhage of anus and rectum: Secondary | ICD-10-CM | POA: Diagnosis not present

## 2021-07-26 DIAGNOSIS — Z79899 Other long term (current) drug therapy: Secondary | ICD-10-CM | POA: Diagnosis not present

## 2021-07-26 HISTORY — PX: FLEXIBLE SIGMOIDOSCOPY: SHX5431

## 2021-07-26 HISTORY — PX: BIOPSY: SHX5522

## 2021-07-26 LAB — CBC
HCT: 37.8 % (ref 36.0–46.0)
Hemoglobin: 11.8 g/dL — ABNORMAL LOW (ref 12.0–15.0)
MCH: 28.2 pg (ref 26.0–34.0)
MCHC: 31.2 g/dL (ref 30.0–36.0)
MCV: 90.4 fL (ref 80.0–100.0)
Platelets: 199 10*3/uL (ref 150–400)
RBC: 4.18 MIL/uL (ref 3.87–5.11)
RDW: 14 % (ref 11.5–15.5)
WBC: 12.9 10*3/uL — ABNORMAL HIGH (ref 4.0–10.5)
nRBC: 0 % (ref 0.0–0.2)

## 2021-07-26 LAB — BASIC METABOLIC PANEL
Anion gap: 9 (ref 5–15)
BUN: 11 mg/dL (ref 8–23)
CO2: 22 mmol/L (ref 22–32)
Calcium: 8.1 mg/dL — ABNORMAL LOW (ref 8.9–10.3)
Chloride: 108 mmol/L (ref 98–111)
Creatinine, Ser: 0.67 mg/dL (ref 0.44–1.00)
GFR, Estimated: >60 mL/min (ref >60)
Glucose, Bld: 67 mg/dL — ABNORMAL LOW (ref 70–99)
Potassium: 3.9 mmol/L (ref 3.5–5.1)
Sodium: 139 mmol/L (ref 135–145)

## 2021-07-26 LAB — GLUCOSE, CAPILLARY
Glucose-Capillary: 115 mg/dL — ABNORMAL HIGH (ref 70–99)
Glucose-Capillary: 64 mg/dL — ABNORMAL LOW (ref 70–99)
Glucose-Capillary: 73 mg/dL (ref 70–99)
Glucose-Capillary: 80 mg/dL (ref 70–99)

## 2021-07-26 LAB — SARS CORONAVIRUS 2 (TAT 6-24 HRS): SARS Coronavirus 2: NEGATIVE

## 2021-07-26 LAB — PROCALCITONIN: Procalcitonin: <0.1 ng/mL

## 2021-07-26 SURGERY — SIGMOIDOSCOPY, FLEXIBLE
Anesthesia: Monitor Anesthesia Care

## 2021-07-26 MED ORDER — PROPOFOL 10 MG/ML IV BOLUS
INTRAVENOUS | Status: DC | PRN
Start: 2021-07-26 — End: 2021-07-26
  Administered 2021-07-26: 20 mg via INTRAVENOUS
  Administered 2021-07-26: 30 mg via INTRAVENOUS

## 2021-07-26 MED ORDER — LACTATED RINGERS IV SOLN: INTRAVENOUS | Status: DC | PRN

## 2021-07-26 MED ORDER — PROPOFOL 1000 MG/100ML IV EMUL
INTRAVENOUS | Status: AC
  Filled 2021-07-26: qty 100

## 2021-07-26 MED ORDER — DEXTROSE 50 % IV SOLN
12.5000 g | INTRAVENOUS | Status: AC
  Administered 2021-07-26: 12.5 g via INTRAVENOUS
  Filled 2021-07-26: qty 50

## 2021-07-26 MED ORDER — PROPOFOL 500 MG/50ML IV EMUL
INTRAVENOUS | Status: DC | PRN
  Administered 2021-07-26: 125 ug/kg/min via INTRAVENOUS

## 2021-07-26 MED ORDER — ONDANSETRON HCL 4 MG/2ML IJ SOLN
INTRAMUSCULAR | Status: DC | PRN
  Administered 2021-07-26: 4 mg via INTRAVENOUS

## 2021-07-26 NOTE — Op Note (Signed)
[facility] Patient Name: Sonya Woods Procedure Date: 07/26/2021 MRN: [id number] Attending MD: Carlota Raspberry. Havery Moros , MD Date of Birth: [birth year] CSN: [id number] Age: 70 Admit Type: Inpatient Procedure:                Flexible Sigmoidoscopy Indications:              Abnormal CT of the GI tract - left sided colitis -                            abdominal pain / rectal bleeding, concern for                            ischemic colitis Providers:                Remo Lipps P. Havery Moros, MD, Particia Nearing, RN, Frazier Richards, Technician, Cletis Athens, Technician Referring MD:              Medicines:                Monitored Anesthesia Care Complications:            No immediate complications. Estimated blood loss:                            Minimal. Estimated Blood Loss:     Estimated blood loss was minimal. Procedure:                Pre-Anesthesia Assessment:                           - Prior to the procedure, a History and Physical                            was performed, and patient medications and                            allergies were reviewed. The patient's tolerance of                            previous anesthesia was also reviewed. The risks                            and benefits of the procedure and the sedation                            options and risks were discussed with the patient.                            All questions were answered, and informed consent                            was obtained. Prior Anticoagulants: The patient has  taken no previous anticoagulant or antiplatelet                            agents. ASA Grade Assessment: III - A patient with                            severe systemic disease. After reviewing the risks                            and benefits, the patient was deemed in                            satisfactory condition to undergo the procedure.                            After obtaining informed consent, the scope was                            passed under direct vision. The [id number]-HQ190L                            ([id number]) Olympus colonoscope was introduced                            through the anus and advanced to the the left                            transverse colon. The flexible sigmoidoscopy was                            accomplished without difficulty. The patient                            tolerated the procedure well. The quality of the                            bowel preparation was fair. Scope In: Scope Out: Findings:      The perianal and digital rectal examinations were normal.      A moderate amount of stool and old blood was found in the entire colon,       making visualization difficult. Lavage of the colon was performed using       copious amounts of sterile water, resulting in clearance with fair       visualization. This process took several minutes.      Inflammation was found in the sigmoid colon, in the descending colon and       at the splenic flexure - erythema with adherent mucous and old clot.       Linear adherent clot / ulceration in the splenic flexure, concerning for       ischemic colitis Biopsies were taken with a cold forceps for histology.      Multiple small-mouthed diverticula were found in the sigmoid colon       associated with luminal narrowing.      The exam was otherwise without abnormality. No active bleeding. Impression:               -  Preparation of the colon was fair with a large                            amount of old stool / old blood which took some                            time to clear.                           - Inflammatory changes in the left colon as                            described above - I suspect most likely represents                            ischemic colitis. Biopsied.                           - Diverticulosis in the sigmoid colon.                           - The examination was  otherwise normal but prep not                            adequate for screening purposes. No active bleeding.                           Overall, clinically patient likely had "mild"                            ischemic colitis, has not had active bleeding for                            close to 48 hours and abdominal pain resolved. Moderate Sedation:      No moderate sedation, case performed with MAC Recommendation:           - Return patient to hospital ward for ongoing care.                           - Advance diet as tolerated - if tolerating regular                            diet today can be discharged home.                           - Continue present medications, resume aspirin if                            not yet done                           - Await pathology results.                           -  Patient can follow up with me as outpatient in                            upcoming weeks, will plan for full colonoscopy to                            perform her colon cancer screening in upcoming                            months Procedure Code(s):        --- Professional ---                           [phone], Sigmoidoscopy, flexible; with biopsy, single                            or multiple Diagnosis Code(s):        --- Professional ---                           K55.9, Vascular disorder of intestine, unspecified                           K57.30, Diverticulosis of large intestine without                            perforation or abscess without bleeding                           R93.3, Abnormal findings on diagnostic imaging of                            other parts of digestive tract CPT copyright 2019 [employer]. All rights reserved. The codes documented in this report are preliminary and upon coder review may  be revised to meet current compliance requirements. Remo Lipps P. Aaylah Pokorny, MD 07/26/2021 8:13:55 AM This report has been signed electronically. Number of  Addenda: 0

## 2021-07-26 NOTE — Transfer of Care (Signed)
Immediate Anesthesia Transfer of Care Note  Patient: Sonya Woods  Procedure(s) Performed: FLEXIBLE SIGMOIDOSCOPY BIOPSY  Patient Location: PACU  Anesthesia Type:MAC  Level of Consciousness: awake, alert , oriented and patient cooperative  Airway & Oxygen Therapy: Patient Spontanous Breathing and Patient connected to face mask  Post-op Assessment: Report given to RN and Post -op Vital signs reviewed and stable  Post vital signs: Reviewed and stable  Last Vitals:  Vitals Value Taken Time  BP 125/93 07/26/21 0800  Temp 37.1 C 07/26/21 0759  Pulse 66 07/26/21 0802  Resp 21 07/26/21 0802  SpO2 99 % 07/26/21 0802  Vitals shown include unvalidated device data.  Last Pain:  Vitals:   07/26/21 0759  TempSrc:   PainSc: 0-No pain         Complications: No notable events documented.

## 2021-07-26 NOTE — Progress Notes (Signed)
PROGRESS NOTE    Sonya Woods  LKT:[id number] DOB: 04-20-1951 DOA: 07/24/2021 PCP: Pcp, No   Brief Narrative:  Sonya Woods is a 70 y.o. female with medical history significant of benign right femur tumor, varicella-zoster, chronic lower back pain, hypertension, history of cigarette smoking, bilateral feet and left arm fractures, vitamin D deficiency, measles, morbid obesity, pure hypercholesterolemia who is coming to the emergency department with complaints of a having multiple episodes of painful rectal bleeding with bright red blood for the past 3 days; off and on bleeding over the past 2 weeks but nothing 'substantial' until the last few days. Fecal occult blood was positive, GI consulted for further recommendations - imaging shows questionable colitis.  Ischemic colitis is a possibility. CTA abdomen showed diffuse atherosclerotic disease throughout the abdomen and pelvis. There was a 3.2 cm infrarenal AAA there was evidence of blood flow to the left colic and sigmoid branches associated with the area of colonic inflammation.  Please see images and full radiology report for further details.  Assessment & Plan:    Painful rectal bleeding, resolving Likely ischemic colitis, resolving, per GI s/p flex sig 07/26/21 Continue holding antibiotics unless procalcitonin positive(currently <10) or worsening abdominal symptoms Antiemetics/analgesics as needed Advanced diet as tolerated Vascular surgery has been consulted - no indication for revascularization - follow up in 4 weeks outpatient as indicated  Essential hypertension Mildly hypotensive - restart home amlodipine as indicated   Pure hypercholesterolemia Currently on Crestor 5 mg p.o. daily   Prediabetes Currently NPO Check CBG every 6 hours   Hypokalemia Continue K supplementation Follow-up potassium level  DVT prophylaxis: SCDs. Code Status: Full code. Family Communication: None  Status is: inpt  Dispo: The patient is  from: Home              Anticipated d/c is to: TBD              Anticipated d/c date is: 48-72              Patient currently NOT medically stable for discharge  Consultants:  GI - [facility]  Procedures:  Colonoscpy, pending above  Antimicrobials:  None   Subjective: No acute issues or events overnight denies nausea vomiting diarrhea constipation headache fevers chills or chest pain.  Abdominal pain resolved - loose/bloody stool ongoing today post scope.  Objective: Vitals:   07/25/21 0415 07/25/21 1336 07/25/21 2127 07/26/21 0527  BP: (!) 135/50 (!) 138/51 (!) 129/48 123/60  Pulse: 66 65 66 72  Resp: 18  18 18   Temp: 98.3 F (36.8 C) (!) 97.2 F (36.2 C) 98 F (36.7 C) 98.1 F (36.7 C)  TempSrc: Oral Oral Oral Oral  SpO2: 98% 100% 100% 97%    Intake/Output Summary (Last 24 hours) at 07/26/2021 3428 Last data filed at 07/25/2021 1800 Gross per 24 hour  Intake 383.99 ml  Output 550 ml  Net -166.01 ml    There were no vitals filed for this visit.  Examination:  General exam: Appears calm and comfortable  Respiratory system: Clear to auscultation. Respiratory effort normal. Cardiovascular system: S1 & S2 heard, RRR. No JVD, murmurs, rubs, gallops or clicks. No pedal edema. Gastrointestinal system: Abdomen is nondistended, soft and nontender. No organomegaly or masses felt. Normal bowel sounds heard. Central nervous system: Alert and oriented. No focal neurological deficits. Extremities: Symmetric 5 x 5 power. Skin: No rashes, lesions or ulcers Psychiatry: Judgement and insight appear normal. Mood & affect appropriate.   Data Reviewed: I have personally  reviewed following labs and imaging studies  CBC: Recent Labs  Lab 07/24/21 1048 07/24/21 1831 07/25/21 0421 07/26/21 0525  WBC 17.5* 15.4* 15.7* 12.9*  HGB 14.5 14.3 12.9 11.8*  HCT 43.9 44.3 39.7 37.8  MCV 86.6 88.1 88.2 90.4  PLT 266 257 211 962    Basic Metabolic Panel: Recent Labs  Lab  07/24/21 1048 07/25/21 0421 07/26/21 0525  NA 137 136 139  K 3.1* 3.3* 3.9  CL 100 104 108  CO2 26 24 22   GLUCOSE 114* 95 67*  BUN 18 15 11   CREATININE 0.90 0.86 0.67  CALCIUM 9.4 8.5* 8.1*  MG 2.0  --   --     GFR: CrCl cannot be calculated (Unknown ideal weight.). Liver Function Tests: Recent Labs  Lab 07/24/21 1048  AST 23  ALT 17  ALKPHOS 60  BILITOT 0.7  PROT 8.4*  ALBUMIN 4.4    No results for input(s): LIPASE, AMYLASE in the last 168 hours. No results for input(s): AMMONIA in the last 168 hours. Coagulation Profile: Recent Labs  Lab 07/24/21 1831  INR 1.1    Cardiac Enzymes: No results for input(s): CKTOTAL, CKMB, CKMBINDEX, TROPONINI in the last 168 hours. BNP (last 3 results) No results for input(s): PROBNP in the last 8760 hours. HbA1C: No results for input(s): HGBA1C in the last 72 hours. CBG: Recent Labs  Lab 07/25/21 1122 07/25/21 1625 07/26/21 0024 07/26/21 0524 07/26/21 0553  GLUCAP 84 79 73 64* 115*    Lipid Profile: No results for input(s): CHOL, HDL, LDLCALC, TRIG, CHOLHDL, LDLDIRECT in the last 72 hours. Thyroid Function Tests: No results for input(s): TSH, T4TOTAL, FREET4, T3FREE, THYROIDAB in the last 72 hours. Anemia Panel: No results for input(s): VITAMINB12, FOLATE, FERRITIN, TIBC, IRON, RETICCTPCT in the last 72 hours. Sepsis Labs: Recent Labs  Lab 07/24/21 1104 07/24/21 1304 07/25/21 0416  PROCALCITON  --   --  <0.10  LATICACIDVEN 1.5 1.2  --      Recent Results (from the past 240 hour(s))  SARS CORONAVIRUS 2 (TAT 6-24 HRS) Nasopharyngeal Nasopharyngeal Swab     Status: None   Collection Time: 07/25/21  3:10 PM   Specimen: Nasopharyngeal Swab  Result Value Ref Range Status   SARS Coronavirus 2 NEGATIVE NEGATIVE Final    Comment: (NOTE) SARS-CoV-2 target nucleic acids are NOT DETECTED.  The SARS-CoV-2 RNA is generally detectable in upper and lower respiratory specimens during the acute phase of infection.  Negative results do not preclude SARS-CoV-2 infection, do not rule out co-infections with other pathogens, and should not be used as the sole basis for treatment or other patient management decisions. Negative results must be combined with clinical observations, patient history, and epidemiological information. The expected result is Negative.  Fact Sheet for Patients: SugarRoll.be  Fact Sheet for Healthcare Providers: https://www.woods-mathews.com/  This test is not yet approved or cleared by the [place] FDA and  has been authorized for detection and/or diagnosis of SARS-CoV-2 by FDA under an Emergency Use Authorization (EUA). This EUA will remain  in effect (meaning this test can be used) for the duration of the COVID-19 declaration under Se ction 564(b)(1) of the Act, 21 U.S.C. section 360bbb-3(b)(1), unless the authorization is terminated or revoked sooner.  Performed at [facility], [place] [address], [place], [place] [address]       Radiology Studies: CT ABDOMEN PELVIS W CONTRAST  Result Date: 07/24/2021 CLINICAL DATA:  Acute abdominal pain. EXAM: CT ABDOMEN AND PELVIS WITH CONTRAST  TECHNIQUE: Multidetector CT imaging of the abdomen and pelvis was performed using the standard protocol following bolus administration of intravenous contrast. CONTRAST:  57mL OMNIPAQUE IOHEXOL 350 MG/ML SOLN COMPARISON:  None. FINDINGS: Lower chest: Lung bases are clear. Hepatobiliary: No focal hepatic lesion. No biliary duct dilatation. Common bile duct is normal. Pancreas: Pancreas is normal. No ductal dilatation. No pancreatic inflammation. Spleen: Normal spleen Adrenals/urinary tract: Adrenal glands and kidneys are normal. The ureters and bladder normal. Stomach/Bowel: Stomach, small-bowel and cecum are normal. The appendix is not identified but there is no pericecal inflammation to suggest appendicitis. Ascending and transverse colon normal.  Beginning in the distal aspect of the transverse colon and continuing through the splenic flexure of the colon and descending colon, there is diffuse submucosal edema with luminal narrowing throughout this section of bowel. There is mild pericolonic fat stranding from the splenic flexure through the RIGHT pericolic gutter. There is no pneumatosis or portal venous gas. There are several diverticula the descending colon and sigmoid colon however the above colonic process appears more diffuse than focal diverticulitis. There is significant intimal calcification in the aorta and at the ostia the SMA and celiac trunk. These vessels are grossly opacified on portal venous phase imaging. The SMA and celiac trunk share a common origin. Calcifications in the proximal IMA. Diverticula sigmoid colon.  Rectum normal Vascular/Lymphatic: Abdominal aorta is normal caliber with atherosclerotic calcification. See GI section for description mesenteric arteries. There is no retroperitoneal or periportal lymphadenopathy. No pelvic lymphadenopathy. Reproductive: Uterus and adnexa unremarkable. Other: No free fluid or intraperitoneal free air. Musculoskeletal: No aggressive osseous lesion. IMPRESSION: 1. Colonic submucosal edema and mild pericolonic inflammation of the LEFT colon from the splenic flexure to the sigmoid colon is most consistent with segmental colitis. Differential would include ischemic colitis, infectious colitis, drug induced colitis, or inflammatory bowel disease. Watershed vascular distribution could favor ischemic colitis. No pneumatosis or portal venous gas. Intimal and ostial calcifications of the aorta and SMA respectively. 2. Sigmoid colon descending colon diverticulosis without clear evidence of diverticulitis. Findings conveyed toCHRISTOPHER TEGELER on 07/24/2021  at12:33. Electronically Signed   By: Suzy Bouchard M.D.   On: 07/24/2021 12:37   CT Angio Abd/Pel w/ and/or w/o  Result Date:  07/24/2021 CLINICAL DATA:  Evaluate colitis. EXAM: CTA ABDOMEN AND PELVIS WITHOUT AND WITH CONTRAST TECHNIQUE: Multidetector CT imaging of the abdomen and pelvis was performed using the standard protocol during bolus administration of intravenous contrast. Multiplanar reconstructed images and MIPs were obtained and reviewed to evaluate the vascular anatomy. CONTRAST:  172mL OMNIPAQUE IOHEXOL 350 MG/ML SOLN COMPARISON:  CT abdomen and pelvis 07/24/2020 FINDINGS: VASCULAR Aorta: Extensive atherosclerotic disease in the abdominal aorta. Proximal abdominal aorta measures up to 2.9 cm. Infrarenal abdominal aorta measures 3.2 cm. Scattered areas of mural thrombus throughout the abdominal aorta. 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 Left gastric artery, splenic artery and common hepatic artery patent. SMA: Origin of the SMA is very close to the origin the celiac trunk and there appears to be high-grade stenosis, greater than 50% at the origin. Diffuse atherosclerotic disease in the SMA. Renals: 2 right renal arteries with at least moderate stenosis involving the right superior renal artery. Mixed plaque at the origin of the left renal artery but difficult to quantify the degree of stenosis. No evidence for renal artery aneurysm. IMA: Patent with calcified plaque. There is arterial flow to the left lower colic and sigmoid branches at the area of inflammation. Inflow: Atherosclerotic calcifications  in the common, internal and external iliac arteries. No significant stenosis involving the common or external iliac arteries. Proximal Outflow: Proximal femoral arteries are patent bilaterally Veins: No obvious venous abnormality within the limitations of this arterial phase study. Review of the MIP images confirms the above findings. NON-VASCULAR Lower chest: Lung bases are clear. Hepatobiliary: Normal appearance of the liver and gallbladder. Pancreas: Unremarkable. No pancreatic ductal dilatation or  surrounding inflammatory changes. Spleen: Normal in size without focal abnormality. Adrenals/Urinary Tract: Normal adrenal glands. Evidence for cortical scarring in both kidneys. No hydronephrosis. Normal urinary bladder. Stomach/Bowel: Again noted is pericolonic stranding throughout the descending colon. Scattered colonic diverticula. No evidence for bowel distention or obstruction. Normal appearance of the stomach. Lymphatic: No abdominal or pelvic lymph node enlargement. Reproductive: Uterus and bilateral adnexa are unremarkable. Other: Negative for ascites. Negative for free air. 5 mm low-density nodule in the anterior left lower abdomen on sequence 4 image 142 is nonspecific. No other significant nodularity in the omentum or peritoneal cavity. Musculoskeletal: Grade 2 anterolisthesis of L4 on L5 secondary to facet arthropathy. Disc space narrowing at L4-L5 and L5-S1. IMPRESSION: VASCULAR 1. Diffuse atherosclerotic disease throughout the abdomen and pelvis. 2. 3.2 cm infrarenal abdominal aortic aneurysm. Recommend follow-up every 3 years. Reference: J Am Coll Radiol 0814;48:185-631. 3. Origin of the celiac trunk and SMA are close to each other and concern for high-grade stenosis involving both of these vessels. No evidence for acute thrombus. However, this pattern disease could be associated with chronic mesenteric ischemia. 4. IMA is patent. There is evidence for blood flow to the left colic and sigmoid branches associated with the area of colonic inflammation. No evidence for acute thromboembolic disease in this area. 5. Probable bilateral renal artery stenosis as described. NON-VASCULAR 1. Persistent inflammatory changes involving the descending colon and findings are similar to the recent comparison examination. Etiology for the colitis remains indeterminate. 2. Mild scarring in both kidneys. 3. Colonic diverticulosis. Electronically Signed   By: Markus Daft M.D.   On: 07/24/2021 14:19    Scheduled Meds:   pantoprazole (PROTONIX) IV  40 mg Intravenous Q24H   Continuous Infusions:  0.9 % NaCl with KCl 20 mEq / L 100 mL/hr at 07/26/21 0314     LOS: 0 days   Time spent: 44min  Corisa Montini C Lynleigh Kovack, DO [facility]  If 7PM-7AM, please contact night-coverage [email or web address]  07/26/2021, 6:42 AM

## 2021-07-26 NOTE — Interval H&P Note (Signed)
History and Physical Interval Note: Patient feeling well this AM. Pain has not recurred, her bleeding has also not recurred. Enemas given this AM for flex sig, no further bleeding. Hgb stable. No fevers. Exam benign - abdomen soft, NT, ND. Overall unchanged from yesterday. Plan for flex sig today to confirm suspected ischemic colitis as she has not had a colonoscopy in a very long time. Have discussed risks / benefits of flex sig and anesthesia and she wishes to proceed. Further recommendations pending the results.   07/26/2021 7:16 AM  Sid Falcon  has presented today for surgery, with the diagnosis of Rectal bleeding, colitis on CT scan.  The various methods of treatment have been discussed with the patient and family. After consideration of risks, benefits and other options for treatment, the patient has consented to  Procedure(s): FLEXIBLE SIGMOIDOSCOPY (N/A) as a surgical intervention.  The patient's history has been reviewed, patient examined, no change in status, stable for surgery.  I have reviewed the patient's chart and labs.  Questions were answered to the patient's satisfaction.     Craig

## 2021-07-26 NOTE — Anesthesia Postprocedure Evaluation (Signed)
Anesthesia Post Note  Patient: Conservation officer, nature  Procedure(s) Performed: FLEXIBLE SIGMOIDOSCOPY BIOPSY     Patient location during evaluation: PACU Anesthesia Type: MAC Level of consciousness: awake and alert Pain management: pain level controlled Vital Signs Assessment: post-procedure vital signs reviewed and stable Respiratory status: spontaneous breathing, nonlabored ventilation, respiratory function stable and patient connected to nasal cannula oxygen Cardiovascular status: stable and blood pressure returned to baseline Postop Assessment: no apparent nausea or vomiting Anesthetic complications: no   No notable events documented.  Last Vitals:  Vitals:   07/26/21 0820 07/26/21 0828  BP: (!) 131/56 (!) 143/60  Pulse: 63 66  Resp: 19 17  Temp:  36.6 C  SpO2: 100% 98%    Last Pain:  Vitals:   07/26/21 0828  TempSrc:   PainSc: 0-No pain                 Tiajuana Amass

## 2021-07-26 NOTE — Anesthesia Procedure Notes (Signed)
Procedure Name: MAC Date/Time: 07/26/2021 7:34 AM Performed by: Claudia Desanctis, CRNA Pre-anesthesia Checklist: Patient identified, Emergency Drugs available, Suction available and Patient being monitored Patient Re-evaluated:Patient Re-evaluated prior to induction Oxygen Delivery Method: Simple face mask

## 2021-07-26 NOTE — Discharge Summary (Signed)
Physician Discharge Summary  Sonya Woods MEQ:[id number] DOB: [birth year] DOA: 07/24/2021  PCP: Pcp, No  Admit date: 07/24/2021 Discharge date: 07/27/2021  Admitted From: Home Disposition: Home  Recommendations for Outpatient Follow-up:  Follow up with PCP in 1-2 weeks Please obtain BMP/CBC in one week  Home Health: None Equipment/Devices: None  Discharge Condition: Stable CODE STATUS: Full Diet recommendation: Per GI, advance as tolerated  Brief/Interim Summary: Sonya Woods is a 70 y.o. female with medical history significant of benign right femur tumor, varicella-zoster, chronic lower back pain, hypertension, history of cigarette smoking, bilateral feet and left arm fractures, vitamin D deficiency, measles, morbid obesity, pure hypercholesterolemia who is coming to the emergency department with complaints of a having multiple episodes of painful rectal bleeding with bright red blood for the past 3 days; off and on bleeding over the past 2 weeks but nothing 'substantial' until the last few days. Fecal occult blood was positive, GI consulted for further recommendations - imaging shows questionable colitis.  Ischemic colitis is a possibility. CTA abdomen showed diffuse atherosclerotic disease throughout the abdomen and pelvis. There was a 3.2 cm infrarenal AAA there was evidence of blood flow to the left colic and sigmoid branches associated with the area of colonic inflammation.    Status post flex sig with GI, unremarkable findings more consistent with ischemic colitis which appears to be resolving on its own, initially evaluated by vascular surgery with no indication for revascularization.  At this time patient is tolerating p.o., hemoglobin is stable otherwise stable and agreeable for discharge home, close follow-up with PCP and GI as scheduled.   Assessment & Plan:   Painful rectal bleeding, likely ischemic colitis, POA Questionable ischemic colitis vs infectious vs other Continue  holding antibiotics unless procalcitonin positive(currently <10) or worsening abdominal symptoms Vascular surgery has been consulted - no indication for revascularization - follow up in 4 weeks outpatient as indicated [facility] following -stable for discharge   Essential hypertension Resume home medication amlodipine at half dose given her blood pressure is moderately well controlled   Pure hypercholesterolemia Currently on Crestor 5 mg p.o. daily   Prediabetes Currently NPO Check CBG every 6 hours   Hypokalemia Continue K supplementation  Discharge Instructions  Discharge Instructions     Call MD for:  extreme fatigue   Complete by: As directed    Call MD for:  persistant dizziness or light-headedness   Complete by: As directed    Call MD for:  severe uncontrolled pain   Complete by: As directed    Diet - low sodium heart healthy   Complete by: As directed    Increase activity slowly   Complete by: As directed       Allergies as of 07/27/2021       Reactions   Angiotensin Receptor Blockers    Losartan Potassium-hctz    Tetanus Toxoids    Thiazide-type Diuretics    Tramadol Other (See Comments)        Medication List     STOP taking these medications    aspirin 81 MG chewable tablet   hydrochlorothiazide 25 MG tablet Commonly known as: HYDRODIURIL       TAKE these medications    acetaminophen 500 MG tablet Commonly known as: TYLENOL Take 1,000 mg by mouth every 6 (six) hours as needed for mild pain.   amLODipine 5 MG tablet Commonly known as: NORVASC Take 1 tablet (5 mg total) by mouth daily. What changed:  medication strength See the new instructions.  B-complex with vitamin C tablet Take 1 tablet by mouth daily.   cholecalciferol 1000 units tablet Commonly known as: VITAMIN D Take 2,000 Units by mouth daily.   CO Q 10 PO Take 200 mg by mouth.   rosuvastatin 5 MG tablet Commonly known as: CRESTOR TAKE 1 TABLET(5 MG) BY MOUTH  DAILY What changed:  how much to take how to take this when to take this additional instructions        Allergies  Allergen Reactions   Angiotensin Receptor Blockers    Losartan Potassium-Hctz    Tetanus Toxoids    Thiazide-Type Diuretics    Tramadol Other (See Comments)    Consultations: GI, [place]  Procedures/Studies: CT ABDOMEN PELVIS W CONTRAST  Result Date: 07/24/2021 CLINICAL DATA:  Acute abdominal pain. EXAM: CT ABDOMEN AND PELVIS WITH CONTRAST TECHNIQUE: Multidetector CT imaging of the abdomen and pelvis was performed using the standard protocol following bolus administration of intravenous contrast. CONTRAST:  52mL OMNIPAQUE IOHEXOL 350 MG/ML SOLN COMPARISON:  None. FINDINGS: Lower chest: Lung bases are clear. Hepatobiliary: No focal hepatic lesion. No biliary duct dilatation. Common bile duct is normal. Pancreas: Pancreas is normal. No ductal dilatation. No pancreatic inflammation. Spleen: Normal spleen Adrenals/urinary tract: Adrenal glands and kidneys are normal. The ureters and bladder normal. Stomach/Bowel: Stomach, small-bowel and cecum are normal. The appendix is not identified but there is no pericecal inflammation to suggest appendicitis. Ascending and transverse colon normal. Beginning in the distal aspect of the transverse colon and continuing through the splenic flexure of the colon and descending colon, there is diffuse submucosal edema with luminal narrowing throughout this section of bowel. There is mild pericolonic fat stranding from the splenic flexure through the RIGHT pericolic gutter. There is no pneumatosis or portal venous gas. There are several diverticula the descending colon and sigmoid colon however the above colonic process appears more diffuse than focal diverticulitis. There is significant intimal calcification in the aorta and at the ostia the SMA and celiac trunk. These vessels are grossly opacified on portal venous phase imaging. The SMA and celiac  trunk share a common origin. Calcifications in the proximal IMA. Diverticula sigmoid colon.  Rectum normal Vascular/Lymphatic: Abdominal aorta is normal caliber with atherosclerotic calcification. See GI section for description mesenteric arteries. There is no retroperitoneal or periportal lymphadenopathy. No pelvic lymphadenopathy. Reproductive: Uterus and adnexa unremarkable. Other: No free fluid or intraperitoneal free air. Musculoskeletal: No aggressive osseous lesion. IMPRESSION: 1. Colonic submucosal edema and mild pericolonic inflammation of the LEFT colon from the splenic flexure to the sigmoid colon is most consistent with segmental colitis. Differential would include ischemic colitis, infectious colitis, drug induced colitis, or inflammatory bowel disease. Watershed vascular distribution could favor ischemic colitis. No pneumatosis or portal venous gas. Intimal and ostial calcifications of the aorta and SMA respectively. 2. Sigmoid colon descending colon diverticulosis without clear evidence of diverticulitis. Findings conveyed toCHRISTOPHER TEGELER on 07/24/2021  at12:33. Electronically Signed   By: Suzy Bouchard M.D.   On: 07/24/2021 12:37   CT Angio Abd/Pel w/ and/or w/o  Result Date: 07/24/2021 CLINICAL DATA:  Evaluate colitis. EXAM: CTA ABDOMEN AND PELVIS WITHOUT AND WITH CONTRAST TECHNIQUE: Multidetector CT imaging of the abdomen and pelvis was performed using the standard protocol during bolus administration of intravenous contrast. Multiplanar reconstructed images and MIPs were obtained and reviewed to evaluate the vascular anatomy. CONTRAST:  169mL OMNIPAQUE IOHEXOL 350 MG/ML SOLN COMPARISON:  CT abdomen and pelvis 07/24/2020 FINDINGS: VASCULAR Aorta: Extensive atherosclerotic disease in the abdominal aorta.  Proximal abdominal aorta measures up to 2.9 cm. Infrarenal abdominal aorta measures 3.2 cm. Scattered areas of mural thrombus throughout the abdominal aorta. 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 Left gastric artery, splenic artery and common hepatic artery patent. SMA: Origin of the SMA is very close to the origin the celiac trunk and there appears to be high-grade stenosis, greater than 50% at the origin. Diffuse atherosclerotic disease in the SMA. Renals: 2 right renal arteries with at least moderate stenosis involving the right superior renal artery. Mixed plaque at the origin of the left renal artery but difficult to quantify the degree of stenosis. No evidence for renal artery aneurysm. IMA: Patent with calcified plaque. There is arterial flow to the left lower colic and sigmoid branches at the area of inflammation. Inflow: Atherosclerotic calcifications in the common, internal and external iliac arteries. No significant stenosis involving the common or external iliac arteries. Proximal Outflow: Proximal femoral arteries are patent bilaterally Veins: No obvious venous abnormality within the limitations of this arterial phase study. Review of the MIP images confirms the above findings. NON-VASCULAR Lower chest: Lung bases are clear. Hepatobiliary: Normal appearance of the liver and gallbladder. Pancreas: Unremarkable. No pancreatic ductal dilatation or surrounding inflammatory changes. Spleen: Normal in size without focal abnormality. Adrenals/Urinary Tract: Normal adrenal glands. Evidence for cortical scarring in both kidneys. No hydronephrosis. Normal urinary bladder. Stomach/Bowel: Again noted is pericolonic stranding throughout the descending colon. Scattered colonic diverticula. No evidence for bowel distention or obstruction. Normal appearance of the stomach. Lymphatic: No abdominal or pelvic lymph node enlargement. Reproductive: Uterus and bilateral adnexa are unremarkable. Other: Negative for ascites. Negative for free air. 5 mm low-density nodule in the anterior left lower abdomen on sequence 4 image 142 is nonspecific. No other significant nodularity  in the omentum or peritoneal cavity. Musculoskeletal: Grade 2 anterolisthesis of L4 on L5 secondary to facet arthropathy. Disc space narrowing at L4-L5 and L5-S1. IMPRESSION: VASCULAR 1. Diffuse atherosclerotic disease throughout the abdomen and pelvis. 2. 3.2 cm infrarenal abdominal aortic aneurysm. Recommend follow-up every 3 years. Reference: J Am Coll Radiol 1610;96:045-409. 3. Origin of the celiac trunk and SMA are close to each other and concern for high-grade stenosis involving both of these vessels. No evidence for acute thrombus. However, this pattern disease could be associated with chronic mesenteric ischemia. 4. IMA is patent. There is evidence for blood flow to the left colic and sigmoid branches associated with the area of colonic inflammation. No evidence for acute thromboembolic disease in this area. 5. Probable bilateral renal artery stenosis as described. NON-VASCULAR 1. Persistent inflammatory changes involving the descending colon and findings are similar to the recent comparison examination. Etiology for the colitis remains indeterminate. 2. Mild scarring in both kidneys. 3. Colonic diverticulosis. Electronically Signed   By: Markus Daft M.D.   On: 07/24/2021 14:19     Subjective: No acute issues or events overnight tolerating p.o.   Discharge Exam: Vitals:   07/26/21 1445 07/26/21 2043  BP: (!) 147/75 126/67  Pulse: 74 71  Resp: 20 20  Temp: 98.1 F (36.7 C) 98.1 F (36.7 C)  SpO2: 100% 96%   Vitals:   07/26/21 0828 07/26/21 0851 07/26/21 1445 07/26/21 2043  BP: (!) 143/60 138/69 (!) 147/75 126/67  Pulse: 66 61 74 71  Resp: 17 16 20 20   Temp: 97.8 F (36.6 C) 97.8 F (36.6 C) 98.1 F (36.7 C) 98.1 F (36.7 C)  TempSrc:  Oral Oral Oral  SpO2:  98% 99% 100% 96%  Weight:      Height:        General: Pt is alert, awake, not in acute distress Cardiovascular: RRR, S1/S2 +, no rubs, no gallops Respiratory: CTA bilaterally, no wheezing, no rhonchi Abdominal: Soft, NT,  ND, bowel sounds + Extremities: no edema, no cyanosis   The results of significant diagnostics from this hospitalization (including imaging, microbiology, ancillary and laboratory) are listed below for reference.     Microbiology: Recent Results (from the past 240 hour(s))  SARS CORONAVIRUS 2 (TAT 6-24 HRS) Nasopharyngeal Nasopharyngeal Swab     Status: None   Collection Time: 07/25/21  3:10 PM   Specimen: Nasopharyngeal Swab  Result Value Ref Range Status   SARS Coronavirus 2 NEGATIVE NEGATIVE Final    Comment: (NOTE) SARS-CoV-2 target nucleic acids are NOT DETECTED.  The SARS-CoV-2 RNA is generally detectable in upper and lower respiratory specimens during the acute phase of infection. Negative results do not preclude SARS-CoV-2 infection, do not rule out co-infections with other pathogens, and should not be used as the sole basis for treatment or other patient management decisions. Negative results must be combined with clinical observations, patient history, and epidemiological information. The expected result is Negative.  Fact Sheet for Patients: SugarRoll.be  Fact Sheet for Healthcare Providers: https://www.woods-mathews.com/  This test is not yet approved or cleared by the [place] FDA and  has been authorized for detection and/or diagnosis of SARS-CoV-2 by FDA under an Emergency Use Authorization (EUA). This EUA will remain  in effect (meaning this test can be used) for the duration of the COVID-19 declaration under Se ction 564(b)(1) of the Act, 21 U.S.C. section 360bbb-3(b)(1), unless the authorization is terminated or revoked sooner.  Performed at [facility], [place] [address], [place], [place] [address]      Labs: BNP (last 3 results) No results for input(s): BNP in the last 8760 hours. Basic Metabolic Panel: Recent Labs  Lab 07/24/21 1048 07/25/21 0421 07/26/21 0525 07/27/21 0448  NA 137 136 139 138   K 3.1* 3.3* 3.9 3.9  CL 100 104 108 107  CO2 26 24 22 24   GLUCOSE 114* 95 67* 87  BUN 18 15 11 14   CREATININE 0.90 0.86 0.67 0.74  CALCIUM 9.4 8.5* 8.1* 8.4*  MG 2.0  --   --   --    Liver Function Tests: Recent Labs  Lab 07/24/21 1048  AST 23  ALT 17  ALKPHOS 60  BILITOT 0.7  PROT 8.4*  ALBUMIN 4.4   No results for input(s): LIPASE, AMYLASE in the last 168 hours. No results for input(s): AMMONIA in the last 168 hours. CBC: Recent Labs  Lab 07/24/21 1048 07/24/21 1831 07/25/21 0421 07/26/21 0525 07/27/21 0448  WBC 17.5* 15.4* 15.7* 12.9* 10.0  HGB 14.5 14.3 12.9 11.8* 12.1  HCT 43.9 44.3 39.7 37.8 37.9  MCV 86.6 88.1 88.2 90.4 89.2  PLT 266 257 211 199 184   Cardiac Enzymes: No results for input(s): CKTOTAL, CKMB, CKMBINDEX, TROPONINI in the last 168 hours. BNP: Invalid input(s): POCBNP CBG: Recent Labs  Lab 07/26/21 0553 07/26/21 1207 07/27/21 0003 07/27/21 0031 07/27/21 0614  GLUCAP 115* 80 68* 84 83   D-Dimer No results for input(s): DDIMER in the last 72 hours. Hgb A1c No results for input(s): HGBA1C in the last 72 hours. Lipid Profile No results for input(s): CHOL, HDL, LDLCALC, TRIG, CHOLHDL, LDLDIRECT in the last 72 hours. Thyroid function studies No results for  input(s): TSH, T4TOTAL, T3FREE, THYROIDAB in the last 72 hours.  Invalid input(s): FREET3 Anemia work up No results for input(s): VITAMINB12, FOLATE, FERRITIN, TIBC, IRON, RETICCTPCT in the last 72 hours. Urinalysis    Component Value Date/Time   COLORURINE YELLOW 07/24/2021 1148   APPEARANCEUR HAZY (A) 07/24/2021 1148   LABSPEC 1.017 07/24/2021 1148   PHURINE 6.0 07/24/2021 1148   GLUCOSEU NEGATIVE 07/24/2021 1148   HGBUR MODERATE (A) 07/24/2021 1148   BILIRUBINUR NEGATIVE 07/24/2021 1148   KETONESUR 5 (A) 07/24/2021 1148   PROTEINUR NEGATIVE 07/24/2021 1148   NITRITE NEGATIVE 07/24/2021 1148   LEUKOCYTESUR NEGATIVE 07/24/2021 1148   Sepsis Labs Invalid input(s):  PROCALCITONIN,  WBC,  LACTICIDVEN Microbiology Recent Results (from the past 240 hour(s))  SARS CORONAVIRUS 2 (TAT 6-24 HRS) Nasopharyngeal Nasopharyngeal Swab     Status: None   Collection Time: 07/25/21  3:10 PM   Specimen: Nasopharyngeal Swab  Result Value Ref Range Status   SARS Coronavirus 2 NEGATIVE NEGATIVE Final    Comment: (NOTE) SARS-CoV-2 target nucleic acids are NOT DETECTED.  The SARS-CoV-2 RNA is generally detectable in upper and lower respiratory specimens during the acute phase of infection. Negative results do not preclude SARS-CoV-2 infection, do not rule out co-infections with other pathogens, and should not be used as the sole basis for treatment or other patient management decisions. Negative results must be combined with clinical observations, patient history, and epidemiological information. The expected result is Negative.  Fact Sheet for Patients: SugarRoll.be  Fact Sheet for Healthcare Providers: https://www.woods-mathews.com/  This test is not yet approved or cleared by the [place] FDA and  has been authorized for detection and/or diagnosis of SARS-CoV-2 by FDA under an Emergency Use Authorization (EUA). This EUA will remain  in effect (meaning this test can be used) for the duration of the COVID-19 declaration under Se ction 564(b)(1) of the Act, 21 U.S.C. section 360bbb-3(b)(1), unless the authorization is terminated or revoked sooner.  Performed at [facility], [place] [address] [place], [place] [address]      Time coordinating discharge: Over 30 minutes  SIGNED:   Little Ishikawa, DO [facility] 07/27/2021, 11:56 AM Pager   If 7PM-7AM, please contact night-coverage [email or web address]

## 2021-07-26 NOTE — Anesthesia Preprocedure Evaluation (Addendum)
Anesthesia Evaluation  Patient identified by MRN, date of birth, ID band Patient awake    Reviewed: Allergy & Precautions, NPO status , Patient's Chart, lab work & pertinent test results  Airway Mallampati: II  TM Distance: >3 FB Neck ROM: Full    Dental   Pulmonary former smoker,    breath sounds clear to auscultation       Cardiovascular hypertension, Pt. on medications  Rhythm:Regular Rate:Normal     Neuro/Psych negative neurological ROS     GI/Hepatic negative GI ROS, Neg liver ROS,   Endo/Other  negative endocrine ROS  Renal/GU negative Renal ROS     Musculoskeletal   Abdominal   Peds  Hematology  (+) anemia ,   Anesthesia Other Findings   Reproductive/Obstetrics                            Anesthesia Physical Anesthesia Plan  ASA: 2  Anesthesia Plan: MAC   Post-op Pain Management:    Induction:   PONV Risk Score and Plan: 2 and Propofol infusion, Ondansetron and Treatment may vary due to age or medical condition  Airway Management Planned: Natural Airway and Simple Face Mask  Additional Equipment:   Intra-op Plan:   Post-operative Plan:   Informed Consent: I have reviewed the patients History and Physical, chart, labs and discussed the procedure including the risks, benefits and alternatives for the proposed anesthesia with the patient or authorized representative who has indicated his/her understanding and acceptance.       Plan Discussed with:   Anesthesia Plan Comments:         Anesthesia Quick Evaluation

## 2021-07-27 DIAGNOSIS — K625 Hemorrhage of anus and rectum: Secondary | ICD-10-CM | POA: Diagnosis not present

## 2021-07-27 LAB — CBC
HCT: 37.9 % (ref 36.0–46.0)
Hemoglobin: 12.1 g/dL (ref 12.0–15.0)
MCH: 28.5 pg (ref 26.0–34.0)
MCHC: 31.9 g/dL (ref 30.0–36.0)
MCV: 89.2 fL (ref 80.0–100.0)
Platelets: 184 10*3/uL (ref 150–400)
RBC: 4.25 MIL/uL (ref 3.87–5.11)
RDW: 13.8 % (ref 11.5–15.5)
WBC: 10 10*3/uL (ref 4.0–10.5)
nRBC: 0 % (ref 0.0–0.2)

## 2021-07-27 LAB — BASIC METABOLIC PANEL
Anion gap: 7 (ref 5–15)
BUN: 14 mg/dL (ref 8–23)
CO2: 24 mmol/L (ref 22–32)
Calcium: 8.4 mg/dL — ABNORMAL LOW (ref 8.9–10.3)
Chloride: 107 mmol/L (ref 98–111)
Creatinine, Ser: 0.74 mg/dL (ref 0.44–1.00)
GFR, Estimated: >60 mL/min (ref >60)
Glucose, Bld: 87 mg/dL (ref 70–99)
Potassium: 3.9 mmol/L (ref 3.5–5.1)
Sodium: 138 mmol/L (ref 135–145)

## 2021-07-27 LAB — GLUCOSE, CAPILLARY
Glucose-Capillary: 68 mg/dL — ABNORMAL LOW (ref 70–99)
Glucose-Capillary: 79 mg/dL (ref 70–99)
Glucose-Capillary: 83 mg/dL (ref 70–99)
Glucose-Capillary: 84 mg/dL (ref 70–99)

## 2021-07-27 MED ORDER — AMLODIPINE BESYLATE 5 MG PO TABS: 5.0000 mg | ORAL_TABLET | Freq: Every day | ORAL | 0 refills | Status: DC

## 2021-07-27 NOTE — Progress Notes (Signed)
Hypoglycemic Event  CBG: 68  Treatment: 4 oz juice/soda  Symptoms: None  Follow-up CBG: Time:0030 CBG Result:84  Possible Reasons for Event: Unknown   [place]

## 2021-07-27 NOTE — Progress Notes (Signed)
Patient has been taught and explained discharge instructions. Patient has no further questions at this time. IV has been removed and site was clean, dry and intact.

## 2021-07-28 ENCOUNTER — Encounter (HOSPITAL_COMMUNITY): Payer: Self-pay | Admitting: Gastroenterology

## 2021-07-29 LAB — SURGICAL PATHOLOGY

## 2021-08-01 ENCOUNTER — Encounter: Payer: Medicare Other | Admitting: Family

## 2021-08-21 ENCOUNTER — Ambulatory Visit: Payer: Medicare Other | Admitting: Gastroenterology

## 2021-08-26 ENCOUNTER — Encounter: Payer: Self-pay | Admitting: Family

## 2021-08-26 ENCOUNTER — Other Ambulatory Visit: Payer: Self-pay

## 2021-08-26 ENCOUNTER — Ambulatory Visit (INDEPENDENT_AMBULATORY_CARE_PROVIDER_SITE_OTHER): Payer: Medicare Other | Admitting: Family

## 2021-08-26 VITALS — BP 145/80 | HR 72 | Temp 96.7°F | Ht 67.0 in | Wt 200.8 lb

## 2021-08-26 DIAGNOSIS — Z23 Encounter for immunization: Secondary | ICD-10-CM

## 2021-08-26 DIAGNOSIS — Z8719 Personal history of other diseases of the digestive system: Secondary | ICD-10-CM

## 2021-08-26 DIAGNOSIS — E7800 Pure hypercholesterolemia, unspecified: Secondary | ICD-10-CM | POA: Diagnosis not present

## 2021-08-26 DIAGNOSIS — I10 Essential (primary) hypertension: Secondary | ICD-10-CM

## 2021-08-26 HISTORY — DX: Personal history of other diseases of the digestive system: Z87.19

## 2021-08-26 HISTORY — DX: Encounter for immunization: Z23

## 2021-08-26 MED ORDER — ROSUVASTATIN CALCIUM 5 MG PO TABS: ORAL_TABLET | ORAL | 1 refills | Status: DC

## 2021-08-26 MED ORDER — HYDROCHLOROTHIAZIDE 25 MG PO TABS: 25.0000 mg | ORAL_TABLET | Freq: Every morning | ORAL | 1 refills | Status: DC

## 2021-08-26 MED ORDER — AMLODIPINE BESYLATE 5 MG PO TABS: 5.0000 mg | ORAL_TABLET | Freq: Every day | ORAL | 1 refills | Status: DC

## 2021-08-26 NOTE — Patient Instructions (Signed)
It was very nice to see you today!  Your medication refills have been sent to your pharmacy. Please schedule a 6 month follow up visit today with fasting labs.   PLEASE NOTE:  If you had any lab tests please let us know if you have not heard back within a few days. You may see your results on MyChart before we have a chance to review them but we will give you a call once they are reviewed by [place]. If we ordered any referrals today, please let us know if you have not heard from their office within the next week.   Please try these tips to maintain a healthy lifestyle:  Eat most of your calories during the day when you are active. Eliminate processed foods including packaged sweets (pies, cakes, cookies), reduce intake of potatoes, white bread, white pasta, and white rice. Look for whole grain options, oat flour or almond flour.  Each meal should contain half fruits/vegetables, one quarter protein, and one quarter carbs (no bigger than a computer mouse).  Cut down on sweet beverages. This includes juice, soda, and sweet tea. Also watch fruit intake, though this is a healthier sweet option, it still contains natural sugar! Limit to 3 servings daily.  Drink at least 1 glass of water with each meal and aim for at least 8 glasses per day  Exercise at least 150 minutes every week.

## 2021-08-26 NOTE — Assessment & Plan Note (Signed)
Stable on Crestor daily, last lipid panel wnl in 03/2020, will recheck next visit.

## 2021-08-26 NOTE — Assessment & Plan Note (Addendum)
Stable with Amlodipine and HCTZ; slightly high today, advised to monitor at home, call back if systolic number is consistently higher than 140.

## 2021-08-26 NOTE — Progress Notes (Signed)
Subjective:     Patient ID: Sonya Woods, female    DOB: 1951/06/21, 70 y.o.   MRN: [id number]  Chief Complaint  Patient presents with   Transitions Of Care   Hypertension    HPI Hypertension: Patient is currently maintained on the following medications for blood pressure: Amlodipine, HCTZ Patient reports good compliance with blood pressure medications. Patient denies chest pain, shortness of breath or swelling. Last 3 blood pressure readings in our office are as follows: BP Readings from Last 3 Encounters:  08/26/21 (!) 145/80  07/26/21 126/67  01/27/21 (!) 173/88  Hyperlipidemia: Patient is currently maintained on the following medication for hyperlipidemia Crestor. Patient denies myalgia or other side effects. Patient reports good compliance with low fat/low cholesterol diet.  Last lipid panel as follows: Lab Results  Component Value Date   CHOL 131 03/11/2020   HDL 40.50 03/11/2020   LDLCALC 65 03/11/2020   TRIG 127.0 03/11/2020   CHOLHDL 3 03/11/2020   The 10-year ASCVD risk score (Arnett DK, et al., 2019) is: 16.6%   Values used to calculate the score:     Age: 12 years     Sex: Female     Is Non-Hispanic African American: No     Diabetic: No     Tobacco smoker: No     Systolic Blood Pressure: 440 mmHg     Is BP treated: Yes     HDL Cholesterol: 40.5 mg/dL     Total Cholesterol: 131 mg/dL    Health Maintenance Due  Topic Date Due   COLONOSCOPY (Pts 45-13yrs Insurance coverage will need to be confirmed)  Never done   MAMMOGRAM  Never done   Zoster Vaccines- Shingrix (1 of 2) Never done   DEXA SCAN  Never done   COVID-19 Vaccine (3 - Booster for Pfizer series) 02/05/2020   Pneumonia Vaccine 45+ Years old (2 - PCV) 02/07/2021    Past Medical History:  Diagnosis Date   Benign tumor of right femur, s/p removal    Chicken pox    Chronic lower back pain    Essential hypertension 08/03/2018   Former smoker, 100 pack year Hx, quit < 10 years ago     Fracture    left foot, right foot and left arm    Low vitamin D level    Measles    Morbid obesity ([place]) 08/03/2018   Pure hypercholesterolemia 08/03/2018   Controlled on current regimen. No side effects. Will continue and recheck in 4 months at physical.     Past Surgical History:  Procedure Laterality Date   APPENDECTOMY     BIOPSY  07/26/2021   Procedure: BIOPSY;  Surgeon: Yetta Flock, MD;  Location: [facility];  Service: Gastroenterology;;   CATARACT EXTRACTION Right    [address] N/A 07/26/2021   Procedure: FLEXIBLE SIGMOIDOSCOPY;  Surgeon: Yetta Flock, MD;  Location: [facility];  Service: Gastroenterology;  Laterality: N/A;   MOHS SURGERY  06/2017   nose    S/P Bone Tumor Surgery       Outpatient Medications Prior to Visit  Medication Sig Dispense Refill   acetaminophen (TYLENOL) 500 MG tablet Take 1,000 mg by mouth every 6 (six) hours as needed for mild pain.     B Complex-C (B-COMPLEX WITH VITAMIN C) tablet Take 1 tablet by mouth daily.     cholecalciferol (VITAMIN D) 1000 units tablet Take 2,000 Units by mouth daily.     Coenzyme Q10 (  CO Q 10 PO) Take 200 mg by mouth.     amLODipine (NORVASC) 5 MG tablet Take 1 tablet (5 mg total) by mouth daily. 30 tablet 0   rosuvastatin (CRESTOR) 5 MG tablet TAKE 1 TABLET(5 MG) BY MOUTH DAILY (Patient taking differently: Take 5 mg by mouth daily.) 90 tablet 1   hydrochlorothiazide (HYDRODIURIL) 25 MG tablet Take 25 mg by mouth daily.     No facility-administered medications prior to visit.    Allergies  Allergen Reactions   Angiotensin Receptor Blockers    Losartan Potassium-Hctz    Tetanus Toxoids    Thiazide-Type Diuretics    Tramadol Other (See Comments)        Objective:    Physical Exam Vitals and nursing note reviewed.  Constitutional:      Appearance: Normal appearance.  Cardiovascular:     Rate and Rhythm: Normal rate and regular rhythm.  Pulmonary:      Effort: Pulmonary effort is normal.     Breath sounds: Normal breath sounds.  Musculoskeletal:        General: Normal range of motion.  Skin:    General: Skin is warm and dry.  Neurological:     Mental Status: She is alert.  Psychiatric:        Mood and Affect: Mood normal.        Behavior: Behavior normal.    BP (!) 145/80   Pulse 72   Temp (!) 96.7 F (35.9 C) (Temporal)   Ht 5\' 7"  (1.702 m)   Wt 200 lb 12.8 oz (91.1 kg)   SpO2 99%   BMI 31.45 kg/m  Wt Readings from Last 3 Encounters:  08/26/21 200 lb 12.8 oz (91.1 kg)  07/26/21 200 lb (90.7 kg)  02/08/20 225 lb (102.1 kg)       Assessment & Plan:   Problem List Items Addressed This Visit       Cardiovascular and Mediastinum   Essential hypertension    Stable with Amlodipine and HCTZ; slightly high today, advised to monitor at home, call back if systolic number is consistently higher than 140.      Relevant Medications   amLODipine (NORVASC) 5 MG tablet   rosuvastatin (CRESTOR) 5 MG tablet   hydrochlorothiazide (HYDRODIURIL) 25 MG tablet     Other   Pure hypercholesterolemia    Stable on Crestor daily, last lipid panel wnl in 03/2020, will recheck next visit.      Relevant Medications   amLODipine (NORVASC) 5 MG tablet   rosuvastatin (CRESTOR) 5 MG tablet   hydrochlorothiazide (HYDRODIURIL) 25 MG tablet   Need for immunization against influenza - Primary   Relevant Orders   Flu Vaccine QUAD High Dose(Fluad) (Completed)   History of lower GI bleeding    In hospital 1 month ago, told she had plaque that broke loose, old dried blood and fecal matter all through colon. Has been doing well since home. Advised on drinking 64oz water qd, high fiber diet to ensure daily soft BMs.       Meds ordered this encounter  Medications   amLODipine (NORVASC) 5 MG tablet    Sig: Take 1 tablet (5 mg total) by mouth daily.    Dispense:  90 tablet    Refill:  1    Order Specific Question:   Supervising Provider     Answer:   ANDY, CAMILLE L [[id number]]   rosuvastatin (CRESTOR) 5 MG tablet    Sig: TAKE 1 TABLET(5 MG) BY MOUTH  DAILY    Dispense:  90 tablet    Refill:  1    Order Specific Question:   Supervising Provider    Answer:   ANDY, CAMILLE L [[id number]]   hydrochlorothiazide (HYDRODIURIL) 25 MG tablet    Sig: Take 1 tablet (25 mg total) by mouth in the morning.    Dispense:  90 tablet    Refill:  1    Order Specific Question:   Supervising Provider    Answer:   ANDY, CAMILLE L [[id number]]

## 2021-08-26 NOTE — Assessment & Plan Note (Signed)
In hospital 1 month ago, told she had plaque that broke loose, old dried blood and fecal matter all through colon. Has been doing well since home. Advised on drinking 64oz water qd, high fiber diet to ensure daily soft BMs.

## 2021-10-03 ENCOUNTER — Other Ambulatory Visit: Payer: Self-pay | Admitting: Family Medicine

## 2021-10-03 DIAGNOSIS — E7800 Pure hypercholesterolemia, unspecified: Secondary | ICD-10-CM

## 2022-02-10 ENCOUNTER — Other Ambulatory Visit: Payer: Self-pay | Admitting: Family

## 2022-02-10 DIAGNOSIS — I10 Essential (primary) hypertension: Secondary | ICD-10-CM

## 2022-02-12 ENCOUNTER — Other Ambulatory Visit: Payer: Self-pay | Admitting: Family Medicine

## 2022-04-08 ENCOUNTER — Telehealth: Payer: Self-pay | Admitting: Family

## 2022-04-08 NOTE — Telephone Encounter (Signed)
Copied from [place] [phone]. Topic: Medicare AWV >> Apr 08, 2022 11:34 AM Devoria Glassing wrote: Reason for CRM: Left message for patient to schedule Annual Wellness Visit.  Please schedule with Nurse Health Advisor Charlott Rakes, RN at [facility]. This appt can be telephone or office visit. Please call [phone] ask for [facility]

## 2022-06-29 ENCOUNTER — Encounter: Payer: Self-pay | Admitting: *Deleted

## 2022-07-03 ENCOUNTER — Other Ambulatory Visit: Payer: Self-pay | Admitting: Family

## 2022-07-03 DIAGNOSIS — E7800 Pure hypercholesterolemia, unspecified: Secondary | ICD-10-CM

## 2022-08-04 ENCOUNTER — Other Ambulatory Visit (INDEPENDENT_AMBULATORY_CARE_PROVIDER_SITE_OTHER): Payer: Medicare Other

## 2022-08-04 ENCOUNTER — Ambulatory Visit (INDEPENDENT_AMBULATORY_CARE_PROVIDER_SITE_OTHER): Payer: Medicare Other | Admitting: Family

## 2022-08-04 ENCOUNTER — Other Ambulatory Visit: Payer: Self-pay | Admitting: Family

## 2022-08-04 ENCOUNTER — Encounter: Payer: Self-pay | Admitting: Family

## 2022-08-04 VITALS — BP 144/75 | HR 67 | Temp 97.3°F | Ht 67.0 in | Wt 194.4 lb

## 2022-08-04 DIAGNOSIS — I10 Essential (primary) hypertension: Secondary | ICD-10-CM

## 2022-08-04 DIAGNOSIS — E7800 Pure hypercholesterolemia, unspecified: Secondary | ICD-10-CM

## 2022-08-04 DIAGNOSIS — E876 Hypokalemia: Secondary | ICD-10-CM

## 2022-08-04 LAB — COMPREHENSIVE METABOLIC PANEL
ALT: 11 U/L (ref 0–35)
AST: 16 U/L (ref 0–37)
Albumin: 4.2 g/dL (ref 3.5–5.2)
Alkaline Phosphatase: 49 U/L (ref 39–117)
BUN: 20 mg/dL (ref 6–23)
CO2: 30 mEq/L (ref 19–32)
Calcium: 10 mg/dL (ref 8.4–10.5)
Chloride: 101 mEq/L (ref 96–112)
Creatinine, Ser: 0.8 mg/dL (ref 0.40–1.20)
GFR: 73.73 mL/min (ref >60.00)
Glucose, Bld: 94 mg/dL (ref 70–99)
Potassium: 3.3 mEq/L — ABNORMAL LOW (ref 3.5–5.1)
Sodium: 141 mEq/L (ref 135–145)
Total Bilirubin: 0.5 mg/dL (ref 0.2–1.2)
Total Protein: 8 g/dL (ref 6.0–8.3)

## 2022-08-04 LAB — LIPID PANEL
Cholesterol: 122 mg/dL (ref 0–200)
HDL: 39.7 mg/dL (ref >39.00)
LDL Cholesterol: 59 mg/dL (ref 0–99)
NonHDL: 81.9
Total CHOL/HDL Ratio: 3
Triglycerides: 116 mg/dL (ref 0.0–149.0)
VLDL: 23.2 mg/dL (ref 0.0–40.0)

## 2022-08-04 MED ORDER — AMLODIPINE BESYLATE 5 MG PO TABS: 7.5000 mg | ORAL_TABLET | Freq: Every day | ORAL | 3 refills | Status: DC

## 2022-08-04 MED ORDER — HYDROCHLOROTHIAZIDE 12.5 MG PO TABS: ORAL_TABLET | ORAL | 3 refills | Status: DC

## 2022-08-04 MED ORDER — HYDROCHLOROTHIAZIDE 12.5 MG PO TABS: 12.5000 mg | ORAL_TABLET | Freq: Every day | ORAL | 3 refills | Status: DC

## 2022-08-04 NOTE — Patient Instructions (Addendum)
It was very nice to see you today!    I will review your lab results via MyChart in a few days.  Increase your Amlodipine to 7.'5mg'$  daily = 1 & 1/2 pills - please use a pill splitter to cut in half -  purchase from the pharmacy. I will send a lower dose of the Hydrochlorothiazide (HCTZ) that causes you to urinate less. Continue to take both pills with your Rosuvastatin in the mornings.  Let me know if you notice more ankle swelling or high or low blood pressure readings. Your goal is 120-130/60-80.  Drink at least 2 liters of water every day!  Have a great week and holiday season!       PLEASE NOTE:  If you had any lab tests please let us know if you have not heard back within a few days. You may see your results on MyChart before we have a chance to review them but we will give you a call once they are reviewed by [place]. If we ordered any referrals today, please let us know if you have not heard from their office within the next week.

## 2022-08-04 NOTE — Assessment & Plan Note (Signed)
   chronic  taking Crestor '5mg'$  qd  last lipids 2 years ago  rechecking today  sending refill  f/u 1 year or prn

## 2022-08-04 NOTE — Assessment & Plan Note (Addendum)
   chronic  taking Amlodipine & HCTZ  walking more, reports feeling better, weight down 6lbs  BP still a little elevated  advised to increase Amlodipine to 7.'5mg'$  qd and decrease HCTZ to 12.'5mg'$  qd  sending refills  f/u 1 year

## 2022-08-04 NOTE — Progress Notes (Signed)
Patient ID: Sonya Woods, female    DOB: [birth year], 71 y.o.   MRN: [id number]  Chief Complaint  Patient presents with   Hypertension   Hyperlipidemia    HPI: Hypertension: Patient is currently maintained on the following medications for blood pressure: Amlodipine, HCTZ Patient reports good compliance with blood pressure medications. Patient denies chest pain, shortness of breath or swelling.  Hyperlipidemia: Patient is currently maintained on the following medication for hyperlipidemia: Crestor 3m. Patient denies myalgia or other side effects. Patient reports good compliance with low fat/low cholesterol diet.  Last lipid panel as follows: Lab Results  Component Value Date   CHOL 131 03/11/2020   HDL 40.50 03/11/2020   LDLCALC 65 03/11/2020   TRIG 127.0 03/11/2020   CHOLHDL 3 03/11/2020    Assessment & Plan:   Problem List Items Addressed This Visit       Cardiovascular and Mediastinum   Essential hypertension    chronic taking Amlodipine & HCTZ walking more, reports feeling better, weight down 6lbs BP still a little elevated advised to increase Amlodipine to 7.552mqd and decrease HCTZ to 12.56m54md sending refills f/u 1 year      Relevant Medications   amLODipine (NORVASC) 5 MG tablet   hydrochlorothiazide (HYDRODIURIL) 12.5 MG tablet   Other Relevant Orders   Comp Met (CMET)     Other   Pure hypercholesterolemia - Primary    chronic taking Crestor 56mg62m last lipids 2 years ago rechecking today sending refill f/u 1 year or prn      Relevant Medications   amLODipine (NORVASC) 5 MG tablet   hydrochlorothiazide (HYDRODIURIL) 12.5 MG tablet   Other Relevant Orders   Comp Met (CMET)   Lipid panel   Subjective:    Outpatient Medications Prior to Visit  Medication Sig Dispense Refill   acetaminophen (TYLENOL) 500 MG tablet Take 1,000 mg by mouth every 6 (six) hours as needed for mild pain.     B Complex-C (B-COMPLEX WITH VITAMIN C) tablet Take 1  tablet by mouth daily.     cholecalciferol (VITAMIN D) 1000 units tablet Take 2,000 Units by mouth daily.     Coenzyme Q10 (CO Q 10 PO) Take 200 mg by mouth.     rosuvastatin (CRESTOR) 5 MG tablet TAKE 1 TABLET(5 MG) BY MOUTH DAILY 90 tablet 1   amLODipine (NORVASC) 5 MG tablet TAKE 1 TABLET(5 MG) BY MOUTH DAILY 90 tablet 1   hydrochlorothiazide (HYDRODIURIL) 25 MG tablet TAKE 1 TABLET(25 MG) BY MOUTH DAILY 90 tablet 1   No facility-administered medications prior to visit.   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 No side effects. Will continue and recheck in 4 months at physical.    Past Surgical History:  Procedure Laterality Date   APPENDECTOMY  BIOPSY  07/26/2021   Procedure: BIOPSY;  Surgeon: Yetta Flock, MD;  Location: [facility];  Service: Gastroenterology;;   CATARACT EXTRACTION Right    CESAREAN [place] N/A 07/26/2021   Procedure: FLEXIBLE SIGMOIDOSCOPY;  Surgeon: Yetta Flock, MD;  Location: [facility];  Service: Gastroenterology;  Laterality: N/A;   MOHS SURGERY  06/2017   nose    S/P Bone Tumor Surgery      Allergies  Allergen Reactions   Angiotensin Receptor Blockers    Losartan Potassium-Hctz    Tetanus Toxoids    Thiazide-Type Diuretics    Tramadol Other (See Comments)      Objective:    Physical Exam Vitals and nursing  note reviewed.  Constitutional:      Appearance: Normal appearance.  Cardiovascular:     Rate and Rhythm: Normal rate and regular rhythm.  Pulmonary:     Effort: Pulmonary effort is normal.     Breath sounds: Normal breath sounds.  Musculoskeletal:        General: Normal range of motion.  Skin:    General: Skin is warm and dry.  Neurological:     Mental Status: She is alert.  Psychiatric:        Mood and Affect: Mood normal.        Behavior: Behavior normal.    BP (!) 144/75 (BP Location: Left Arm, Patient Position: Sitting, Cuff Size: Large)   Pulse 67   Temp (!) 97.3 F (36.3 C) (Temporal)   Ht _0  (1.702 m)   Wt 194 lb 6.4 oz (88.2 kg)   SpO2 96%   BMI 30.45 kg/m  Wt Readings from Last 3 Encounters:  08/04/22 194 lb 6.4 oz (88.2 kg)  08/26/21 200 lb 12.8 oz (91.1 kg)  07/26/21 200 lb (90.7 kg)       Jeanie Sewer, NP

## 2022-08-07 ENCOUNTER — Ambulatory Visit: Payer: Medicare Other

## 2022-08-07 NOTE — Progress Notes (Signed)
Your glucose (sugar), electrolytes, kidney and liver function and cholesterol numbers are all good.   Your potassium is a little low and this is important for proper heart function. With cutting your Hydrochlorothiazide pill in half, your potassium should be normal now. I'd like to recheck this next week. Please call the office and schedule a lab only appointment, can be non-fasting any time. Continue taking your meds daily, keep up the good work with controlling your diet, and continue to try and shoot for 30 minutes of exercise daily!

## 2022-08-07 NOTE — Addendum Note (Signed)
Addended byJeanie Sewer on: 08/07/2022 12:36 PM   Modules accepted: Orders

## 2022-09-17 ENCOUNTER — Encounter: Payer: Self-pay | Admitting: *Deleted

## 2022-09-30 IMAGING — CT CT ABD-PELV W/ CM
2 of 5 series · 16 of 46 positions shown, 18 images · IV contrast (OMNIPAQUE 350)
Comparison: None.

CLINICAL DATA: Acute abdominal pain.

EXAM:
CT ABDOMEN AND PELVIS WITH CONTRAST
TECHNIQUE: Multidetector CT imaging of the abdomen and pelvis was performed
using the standard protocol following bolus administration of
intravenous contrast.
CONTRAST:  80mL OMNIPAQUE IOHEXOL 350 MG/ML SOLN

[Series 2: axial st · axial · 0.86mm/px · z∈[+1216,+1571]mm · 13 of 83 slices shown, 15 images]
[im 6/83  soft-tissue]
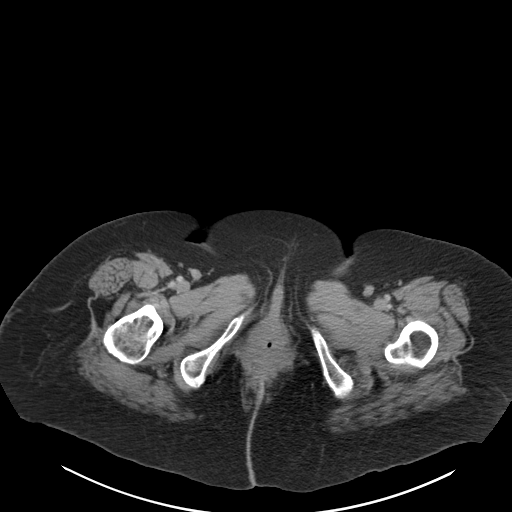
[im 6/83  bone]
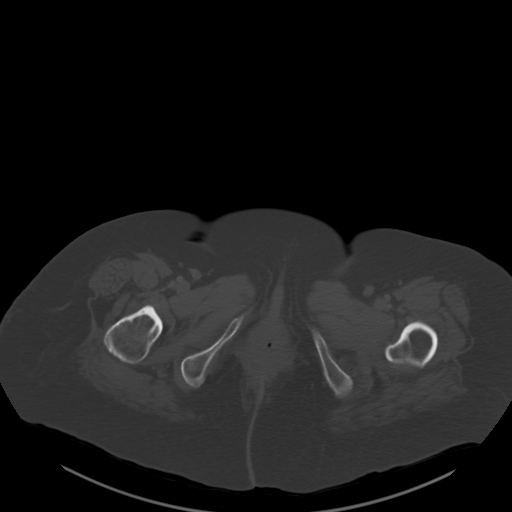
[im 11/83  soft-tissue]
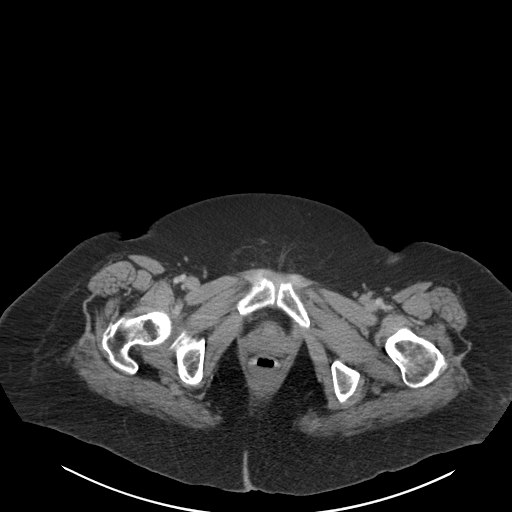
[im 17/83  soft-tissue]
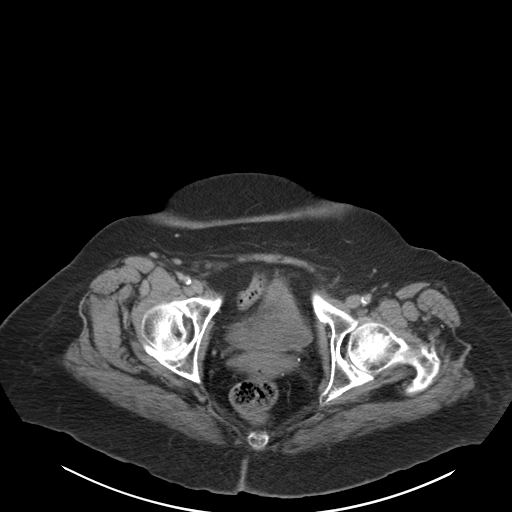
[im 22/83  soft-tissue]
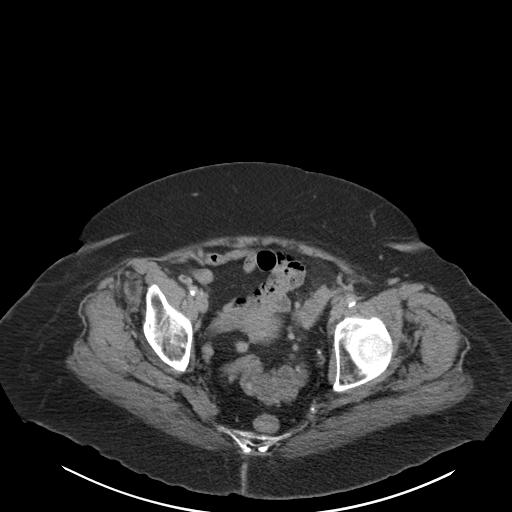
[im 28/83  soft-tissue]
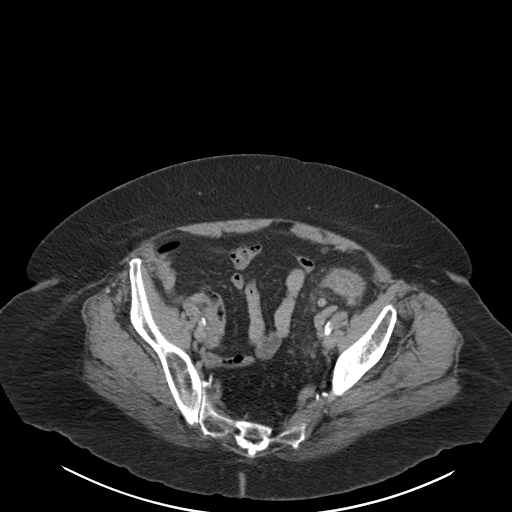
[im 33/83  soft-tissue]
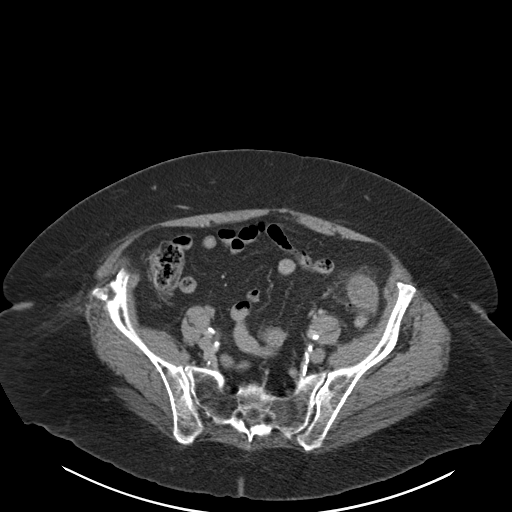
[im 44/83  soft-tissue]
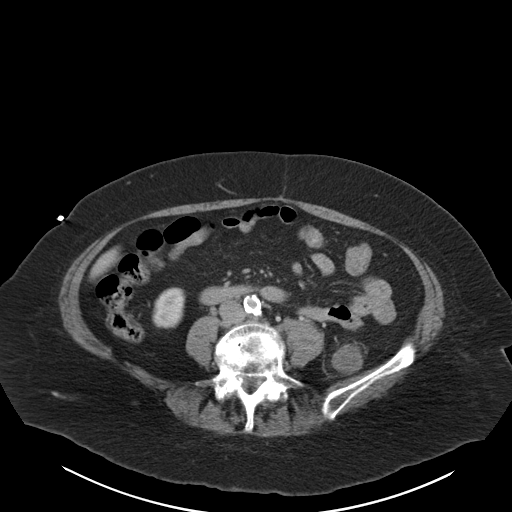
[im 50/83  soft-tissue]
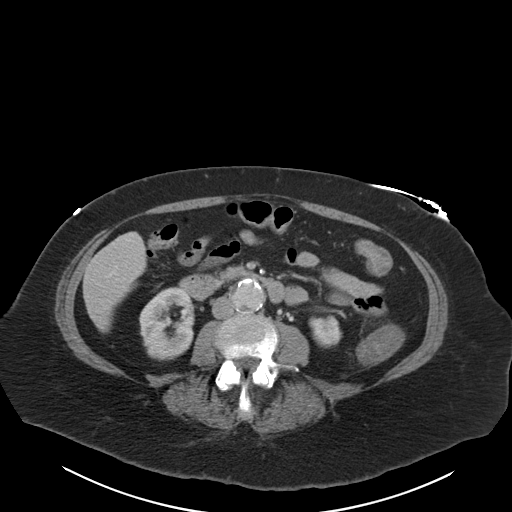
[im 55/83  soft-tissue]
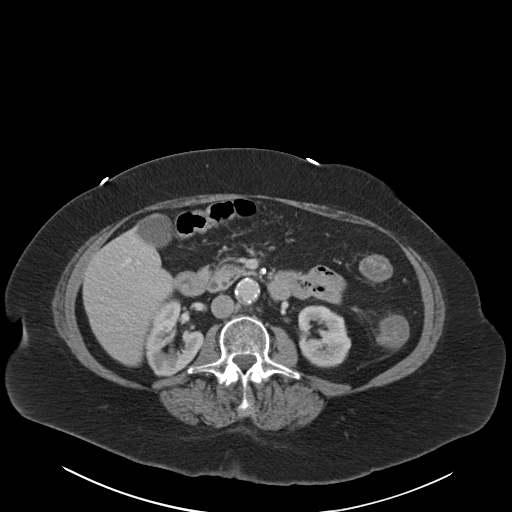
[im 55/83  bone]
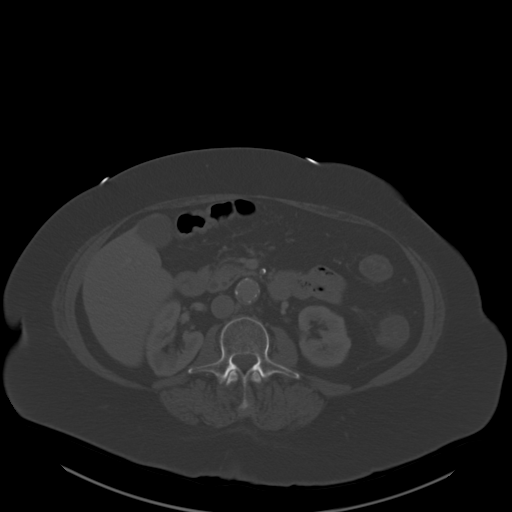
[im 61/83  soft-tissue]
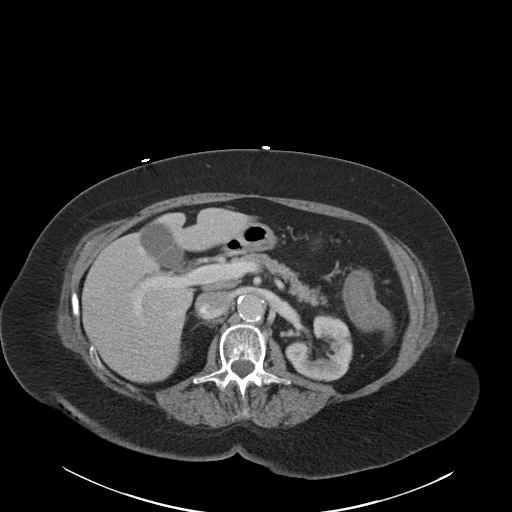
[im 66/83  soft-tissue]
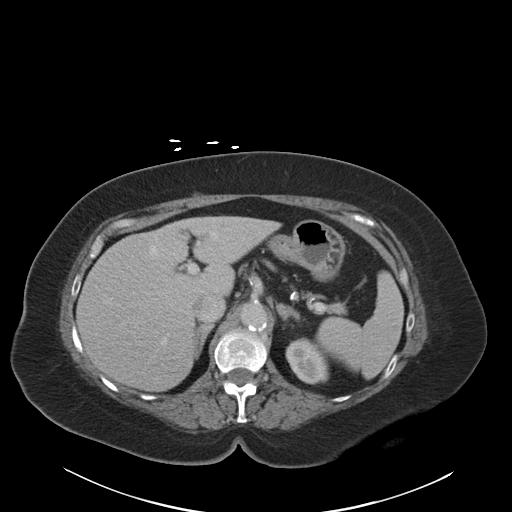
[im 72/83  soft-tissue]
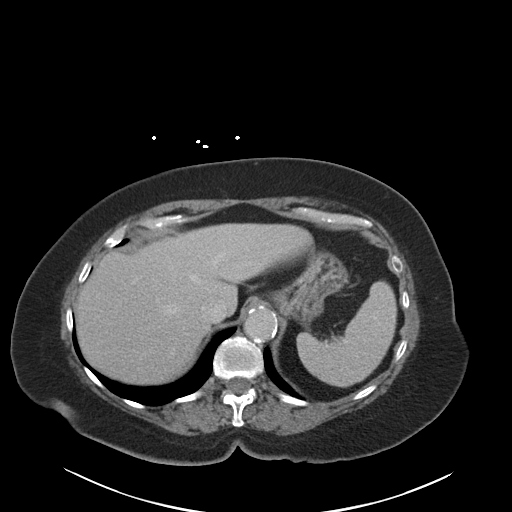
[im 77/83  soft-tissue]
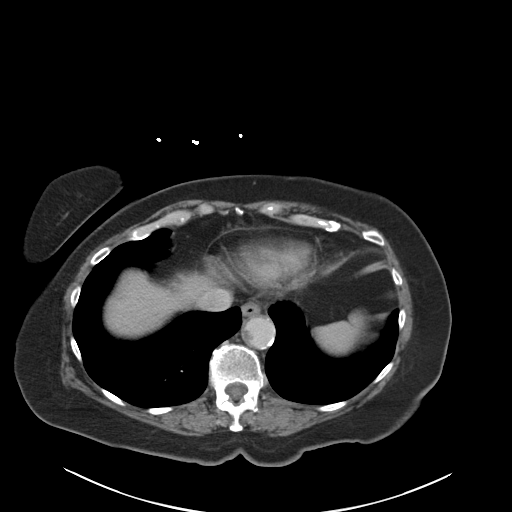

[Series 5: coronal st · coronal · 0.86mm/px · 3 of 116 slices shown]
[im 39/116  soft-tissue]
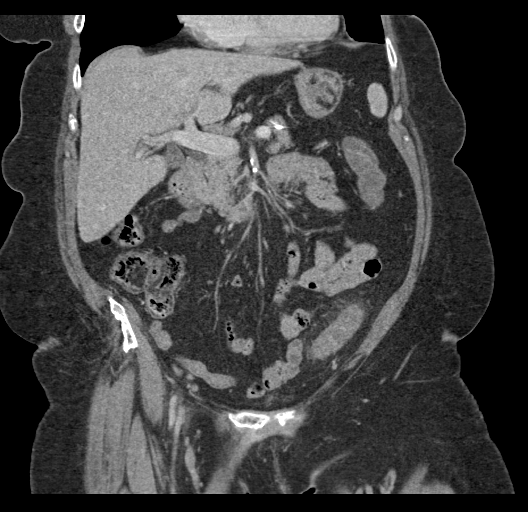
[im 52/116  soft-tissue]
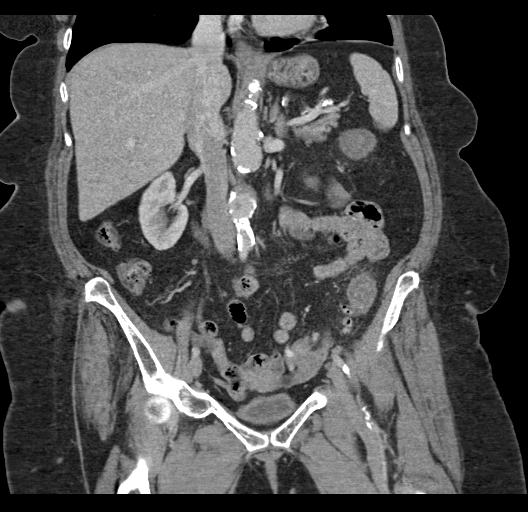
[im 64/116  soft-tissue]
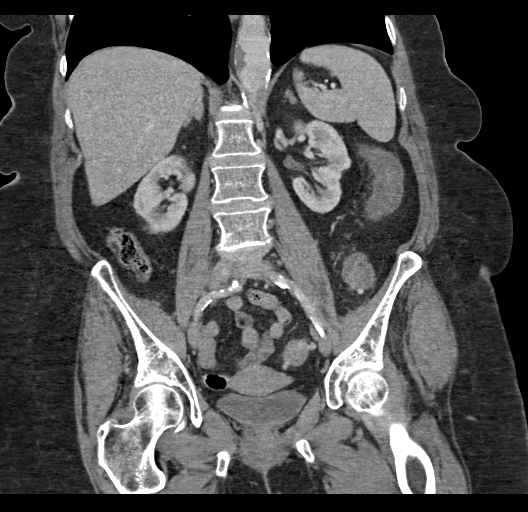

[16 of 46 positions shown; findings below may reference images not displayed]

FINDINGS: Lower chest: Lung bases are clear.

Hepatobiliary: No focal hepatic lesion. No biliary duct dilatation.
Common bile duct is normal.

Pancreas: Pancreas is normal. No ductal dilatation. No pancreatic
inflammation.

Spleen: Normal spleen

Adrenals/urinary tract: Adrenal glands and kidneys are normal. The
ureters and bladder normal.

Stomach/Bowel: Stomach, small-bowel and cecum are normal. The
appendix is not identified but there is no pericecal inflammation to
suggest appendicitis. Ascending and transverse colon normal.

Beginning in the distal aspect of the transverse colon and
continuing through the splenic flexure of the colon and descending
colon, there is diffuse submucosal edema with luminal narrowing
throughout this section of bowel. There is mild pericolonic fat
stranding from the splenic flexure through the RIGHT pericolic
gutter. There is no pneumatosis or portal venous gas.

There are several diverticula the descending colon and sigmoid colon
however the above colonic process appears more diffuse than focal
diverticulitis.

There is significant intimal calcification in the aorta and at the
ostia the SMA and celiac trunk. These vessels are grossly opacified
on portal venous phase imaging. The SMA and celiac trunk share a
common origin. Calcifications in the proximal IMA.

Diverticula sigmoid colon.  Rectum normal

Vascular/Lymphatic: Abdominal aorta is normal caliber with
atherosclerotic calcification. See GI section for description
mesenteric arteries. There is no retroperitoneal or periportal
lymphadenopathy. No pelvic lymphadenopathy.

Reproductive: Uterus and adnexa unremarkable.

Other: No free fluid or intraperitoneal free air.

Musculoskeletal: No aggressive osseous lesion.
IMPRESSION: 1. Colonic submucosal edema and mild pericolonic inflammation of the
LEFT colon from the splenic flexure to the sigmoid colon is most
consistent with segmental colitis. Differential would include
ischemic colitis, infectious colitis, drug induced colitis, or
inflammatory bowel disease. Watershed vascular distribution could
favor ischemic colitis. No pneumatosis or portal venous gas. Intimal
and ostial calcifications of the aorta and SMA respectively.
2. Sigmoid colon descending colon diverticulosis without clear
evidence of diverticulitis.

Findings conveyed Wanderson Olalde on 07/24/2021  at[DATE].

## 2022-09-30 IMAGING — CT CT CTA ABD/PEL W/CM AND/OR W/O CM
2 of 6 series · 15 of 46 positions shown, 17 images · IV contrast (omnipaque)
Comparison: CT abdomen and pelvis 07/24/2020

CLINICAL DATA: Evaluate colitis.

EXAM:
CTA ABDOMEN AND PELVIS WITHOUT AND WITH CONTRAST
TECHNIQUE: Multidetector CT imaging of the abdomen and pelvis was performed
using the standard protocol during bolus administration of
intravenous contrast. Multiplanar reconstructed images and MIPs were
obtained and reviewed to evaluate the vascular anatomy.
CONTRAST:  100mL OMNIPAQUE IOHEXOL 350 MG/ML SOLN

[Series 4: axial arterial · axial · arterial · 0.98mm/px · z∈[+1037,+1421]mm · 12 of 228 slices shown, 14 images]
[im 18/228  soft-tissue]
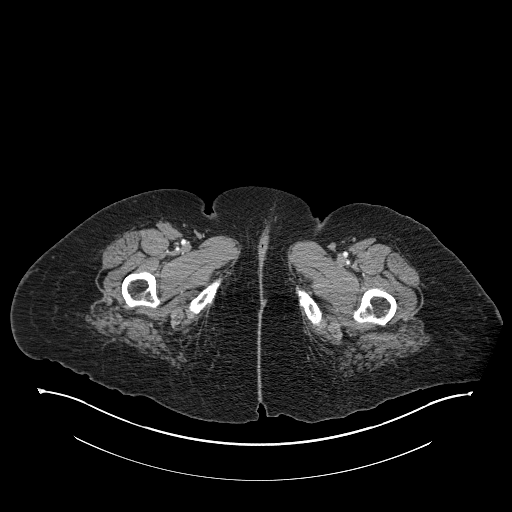
[im 18/228  bone]
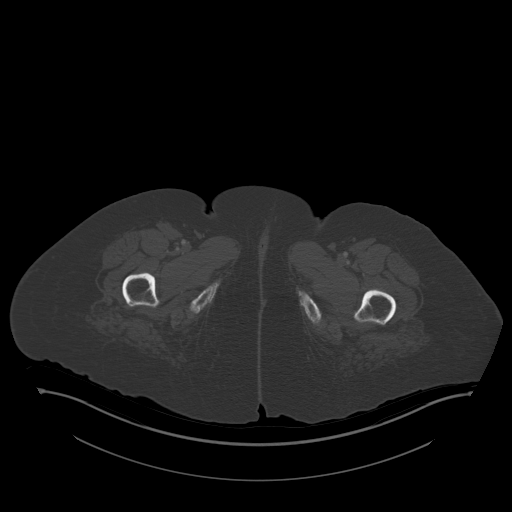
[im 35/228  soft-tissue]
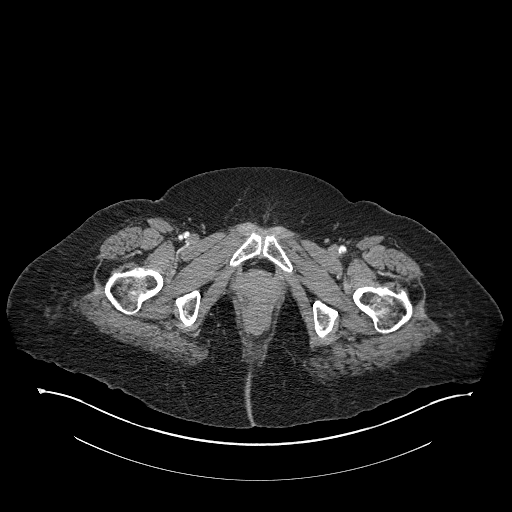
[im 53/228  soft-tissue]
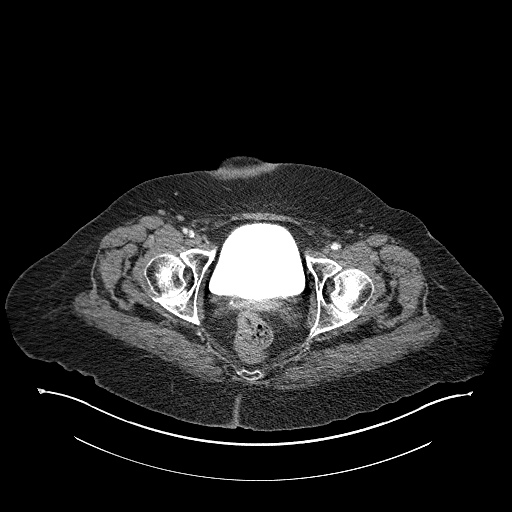
[im 70/228  soft-tissue]
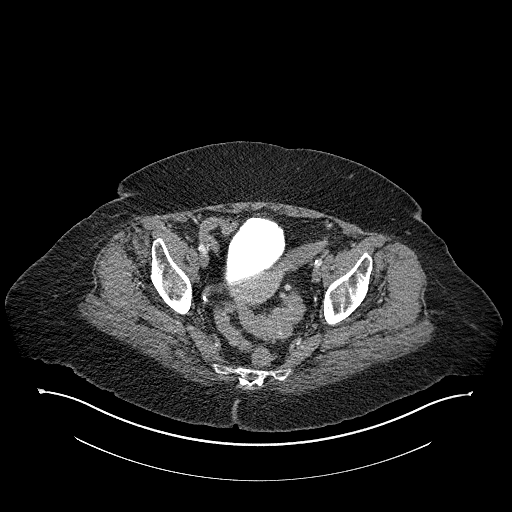
[im 88/228  soft-tissue]
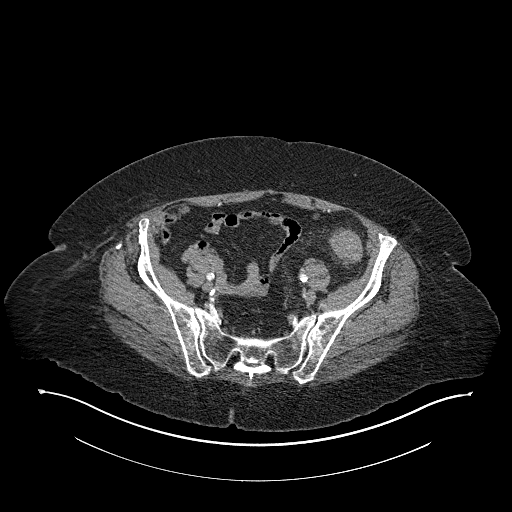
[im 105/228  soft-tissue]
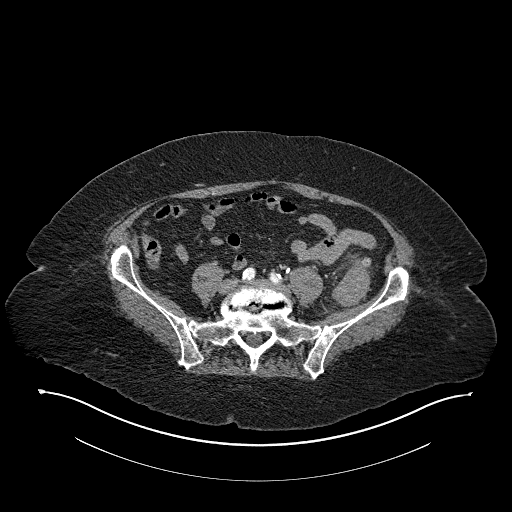
[im 123/228  soft-tissue]
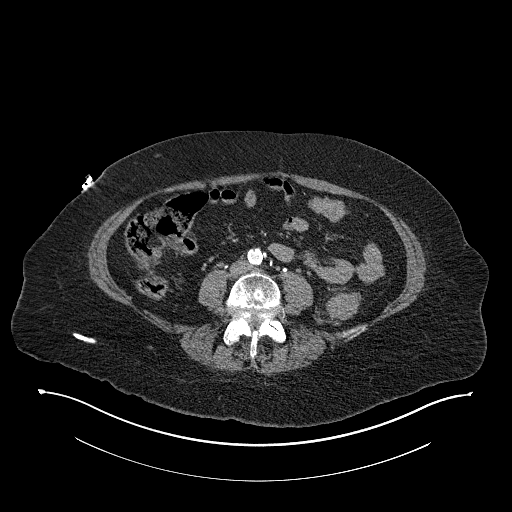
[im 140/228  soft-tissue]
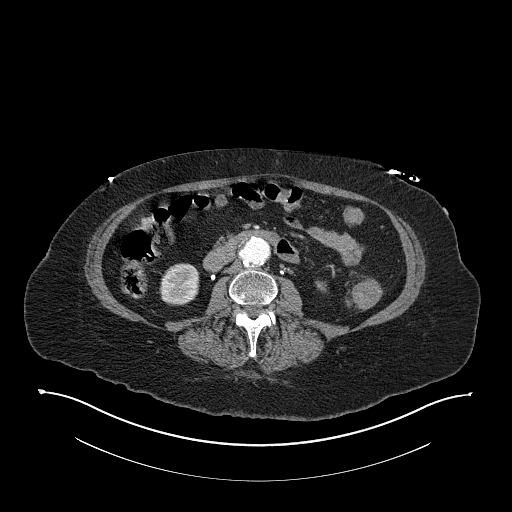
[im 158/228  soft-tissue]
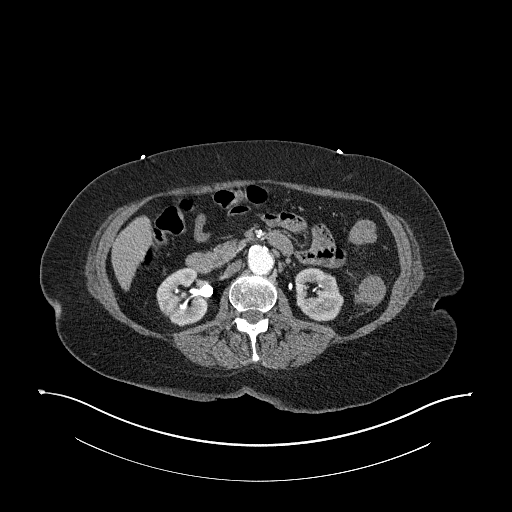
[im 158/228  bone]
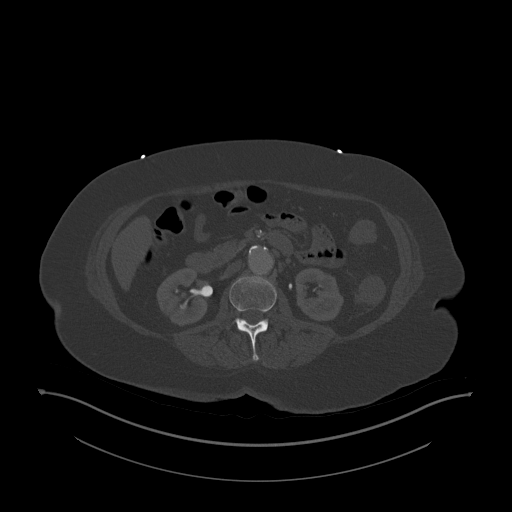
[im 175/228  soft-tissue]
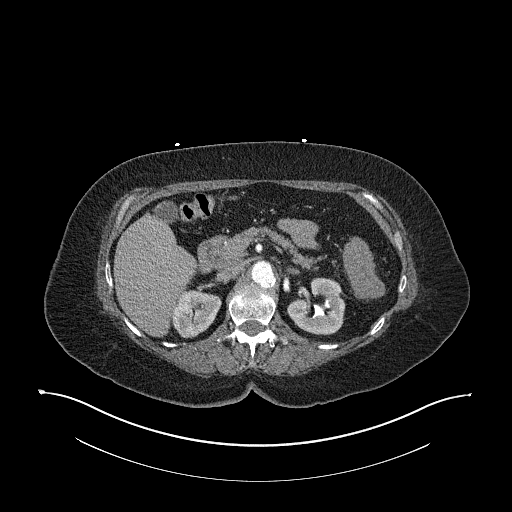
[im 193/228  soft-tissue]
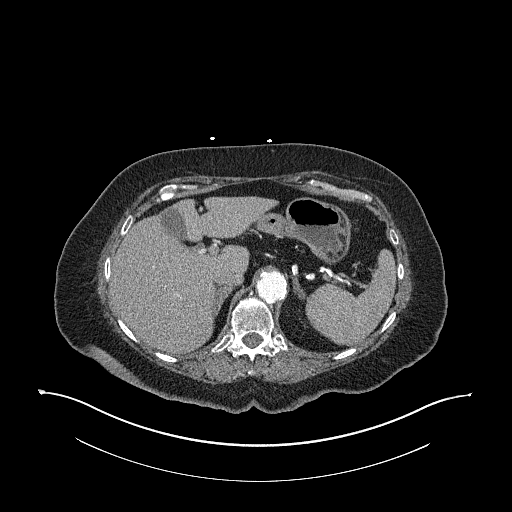
[im 210/228  soft-tissue]
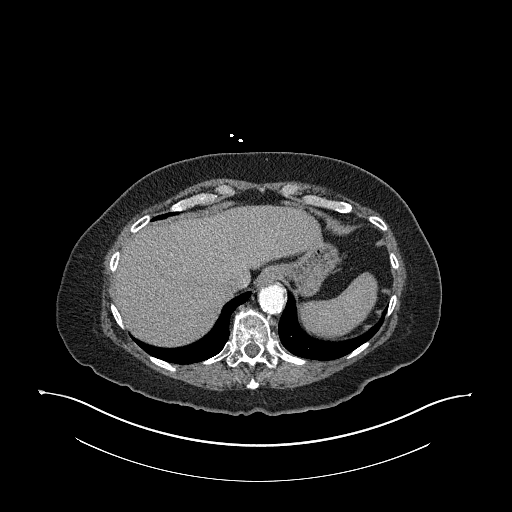

[Series 7: coronal mpr · coronal · 0.88mm/px · 3 of 161 slices shown]
[im 33/161  soft-tissue]
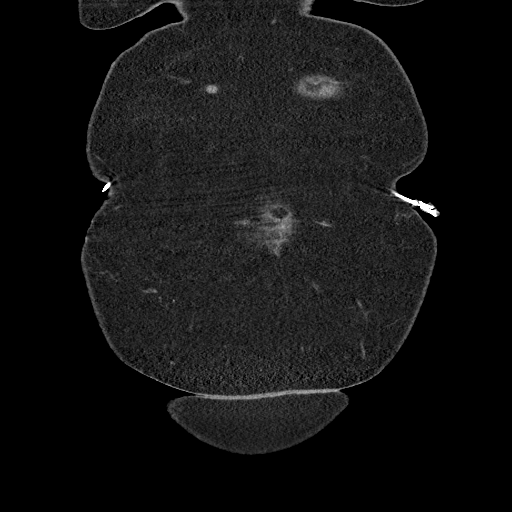
[im 65/161  soft-tissue]
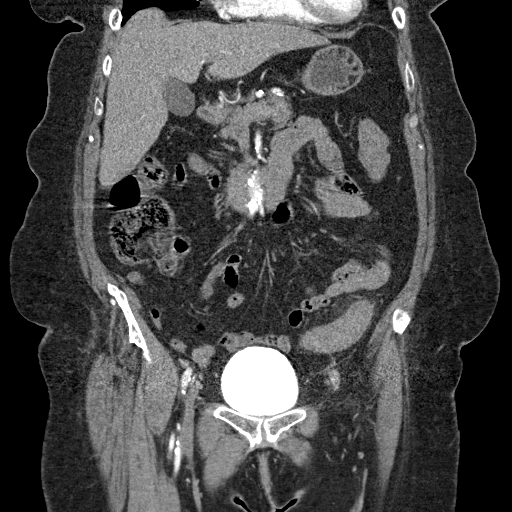
[im 97/161  soft-tissue]
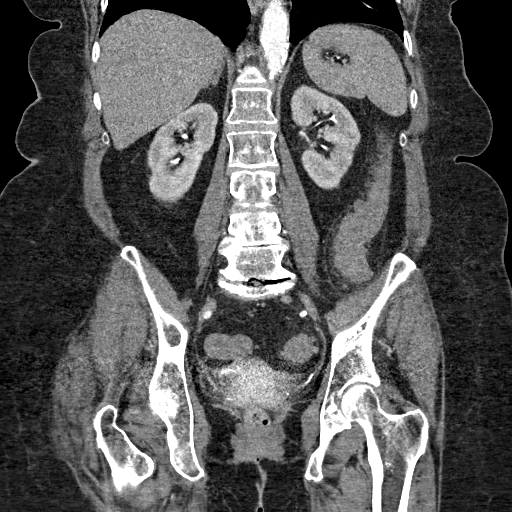

[15 of 46 positions shown; findings below may reference images not displayed]

FINDINGS: VASCULAR

Aorta: Extensive atherosclerotic disease in the abdominal aorta.
Proximal abdominal aorta measures up to 2.9 cm. Infrarenal abdominal
aorta measures 3.2 cm. Scattered areas of mural thrombus throughout
the abdominal aorta.

Celiac: High-grade stenosis at the origin of the celiac trunk. Left
gastric artery, splenic artery and common hepatic artery patent.

SMA: Origin of the SMA is very close to the origin the celiac trunk
and there appears to be high-grade stenosis, greater than 50% at the
origin. Diffuse atherosclerotic disease in the SMA.

Renals: 2 right renal arteries with at least moderate stenosis
involving the right superior renal artery. Mixed plaque at the
origin of the left renal artery but difficult to quantify the degree
of stenosis. No evidence for renal artery aneurysm.

IMA: Patent with calcified plaque. There is arterial flow to the
left lower colic and sigmoid branches at the area of inflammation.

Inflow: Atherosclerotic calcifications in the common, internal and
external iliac arteries. No significant stenosis involving the
common or external iliac arteries.

Proximal Outflow: Proximal femoral arteries are patent bilaterally

Veins: No obvious venous abnormality within the limitations of this
arterial phase study.

Review of the MIP images confirms the above findings.

NON-VASCULAR

Lower chest: Lung bases are clear.

Hepatobiliary: Normal appearance of the liver and gallbladder.

Pancreas: Unremarkable. No pancreatic ductal dilatation or
surrounding inflammatory changes.

Spleen: Normal in size without focal abnormality.

Adrenals/Urinary Tract: Normal adrenal glands. Evidence for cortical
scarring in both kidneys. No hydronephrosis. Normal urinary bladder.

Stomach/Bowel: Again noted is pericolonic stranding throughout the
descending colon. Scattered colonic diverticula. No evidence for
bowel distention or obstruction. Normal appearance of the stomach.

Lymphatic: No abdominal or pelvic lymph node enlargement.

Reproductive: Uterus and bilateral adnexa are unremarkable.

Other: Negative for ascites. Negative for free air. 5 mm low-density
nodule in the anterior left lower abdomen on sequence 4 image 142 is
nonspecific. No other significant nodularity in the omentum or
peritoneal cavity.

Musculoskeletal: Grade 2 anterolisthesis of L4 on L5 secondary to
facet arthropathy. Disc space narrowing at L4-L5 and L5-S1.
IMPRESSION: VASCULAR

1. Diffuse atherosclerotic disease throughout the abdomen and
pelvis.
2. 3.2 cm infrarenal abdominal aortic aneurysm. Recommend follow-up
every 3 years.
Reference: [HOSPITAL] 4863;[DATE].
3. Origin of the celiac trunk and SMA are close to each other and
concern for high-grade stenosis involving both of these vessels. No
evidence for acute thrombus. However, this pattern disease could be
associated with chronic mesenteric ischemia.
4. IMA is patent. There is evidence for blood flow to the left colic
and sigmoid branches associated with the area of colonic
inflammation. No evidence for acute thromboembolic disease in this
area.
5. Probable bilateral renal artery stenosis as described.

NON-VASCULAR

1. Persistent inflammatory changes involving the descending colon
and findings are similar to the recent comparison examination.
Etiology for the colitis remains indeterminate.
2. Mild scarring in both kidneys.
3. Colonic diverticulosis.

## 2022-10-12 ENCOUNTER — Ambulatory Visit (INDEPENDENT_AMBULATORY_CARE_PROVIDER_SITE_OTHER): Payer: Medicare Other | Admitting: Family

## 2022-10-12 ENCOUNTER — Encounter: Payer: Self-pay | Admitting: Family

## 2022-10-12 DIAGNOSIS — I10 Essential (primary) hypertension: Secondary | ICD-10-CM

## 2022-10-12 DIAGNOSIS — E7800 Pure hypercholesterolemia, unspecified: Secondary | ICD-10-CM | POA: Diagnosis not present

## 2022-10-12 MED ORDER — HYDROCHLOROTHIAZIDE 12.5 MG PO TABS: 12.5000 mg | ORAL_TABLET | Freq: Every day | ORAL | 3 refills | Status: DC

## 2022-10-12 MED ORDER — AMLODIPINE BESYLATE 5 MG PO TABS: 5.0000 mg | ORAL_TABLET | Freq: Two times a day (BID) | ORAL | 1 refills | Status: DC

## 2022-10-12 MED ORDER — ROSUVASTATIN CALCIUM 5 MG PO TABS: ORAL_TABLET | ORAL | 1 refills | Status: DC

## 2022-10-12 NOTE — Assessment & Plan Note (Signed)
chronic taking Crestor '5mg'$  qd lipids checked last visit, wnl sending refill f/u 1 year or prn

## 2022-10-12 NOTE — Assessment & Plan Note (Signed)
chronic taking Amlodipine & HCTZ advised last visit to increase Amlodipine to 7.'5mg'$  qd and decrease HCTZ to 12.'5mg'$  qd BP a little elevated increasing Amlodipine to '5mg'$  bid, continue HCTZ qam f/u 1 month

## 2022-10-12 NOTE — Progress Notes (Signed)
Patient ID: Sonya Woods, female    DOB: 01-Sep-1951, 72 y.o.   MRN: [id number]  Chief Complaint  Patient presents with   Hypertension    Follow up   Hyperlipidemia    HPI: Hypertension: Patient is currently maintained on the following medications for blood pressure: Amlodipine, HCTZ Patient reports good compliance with blood pressure medications. Patient denies chest pain, shortness of breath or swelling.  Hyperlipidemia: Patient is currently maintained on the following medication for hyperlipidemia: Crestor '5mg'$ . Patient denies myalgia or other side effects. Patient reports good compliance with low fat/low cholesterol diet.   Assessment & Plan:   Problem List Items Addressed This Visit       Cardiovascular and Mediastinum   Essential hypertension    chronic taking Amlodipine & HCTZ advised last visit to increase Amlodipine to 7.'5mg'$  qd and decrease HCTZ to 12.'5mg'$  qd BP a little elevated increasing Amlodipine to '5mg'$  bid, continue HCTZ qam f/u 1 month      Relevant Medications   hydrochlorothiazide (HYDRODIURIL) 12.5 MG tablet   rosuvastatin (CRESTOR) 5 MG tablet   amLODipine (NORVASC) 5 MG tablet     Other   Pure hypercholesterolemia    chronic taking Crestor '5mg'$  qd lipids checked last visit, wnl sending refill f/u 1 year or prn      Relevant Medications   hydrochlorothiazide (HYDRODIURIL) 12.5 MG tablet   rosuvastatin (CRESTOR) 5 MG tablet   amLODipine (NORVASC) 5 MG tablet    Subjective:    Outpatient Medications Prior to Visit  Medication Sig Dispense Refill   acetaminophen (TYLENOL) 500 MG tablet Take 1,000 mg by mouth every 6 (six) hours as needed for mild pain.     B Complex-C (B-COMPLEX WITH VITAMIN C) tablet Take 1 tablet by mouth daily.     cholecalciferol (VITAMIN D) 1000 units tablet Take 2,000 Units by mouth daily.     Coenzyme Q10 (CO Q 10 PO) Take 200 mg by mouth.     amLODipine (NORVASC) 5 MG tablet Take 1.5 tablets (7.5 mg total) by mouth  daily. 135 tablet 3   hydrochlorothiazide (HYDRODIURIL) 12.5 MG tablet Take 1 tablet (12.5 mg total) by mouth daily. 90 tablet 3   rosuvastatin (CRESTOR) 5 MG tablet TAKE 1 TABLET(5 MG) BY MOUTH DAILY 90 tablet 1   No facility-administered medications prior to visit.   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 No side effects. Will continue and recheck in 4 months at physical.    Past Surgical History:  Procedure Laterality Date   APPENDECTOMY     BIOPSY  07/26/2021   Procedure: BIOPSY;  Surgeon: Yetta Flock, MD;  Location: [facility];  Service: Gastroenterology;;   CATARACT EXTRACTION Right    [place] N/A 07/26/2021   Procedure:  FLEXIBLE SIGMOIDOSCOPY;  Surgeon: Yetta Flock, MD;  Location: [facility];  Service: Gastroenterology;  Laterality: N/A;   MOHS SURGERY  06/2017   nose    S/P Bone Tumor Surgery      Allergies  Allergen Reactions   Angiotensin Receptor Blockers    Losartan Potassium-Hctz    Tetanus Toxoids    Thiazide-Type Diuretics    Tramadol Other (See Comments)      Objective:    Physical Exam Vitals and nursing note reviewed.  Constitutional:      Appearance: Normal appearance.  Cardiovascular:     Rate and Rhythm: Normal rate and regular rhythm.  Pulmonary:     Effort: Pulmonary effort is normal.     Breath sounds:  Normal breath sounds.  Musculoskeletal:        General: Normal range of motion.  Skin:    General: Skin is warm and dry.  Neurological:     Mental Status: She is alert.  Psychiatric:        Mood and Affect: Mood normal.        Behavior: Behavior normal.    BP (!) 157/71 (BP Location: Left Arm, Patient Position: Sitting, Cuff Size: Large)   Pulse 65   Temp (!) 97.3 F (36.3 C) (Temporal)   Ht '5\' 7"'$  (1.702 m)   Wt 192 lb 6.4 oz (87.3 kg)   SpO2 99%   BMI 30.13 kg/m  Wt Readings from Last 3 Encounters:  10/12/22 192 lb 6.4 oz (87.3 kg)  08/04/22 194 lb 6.4 oz (88.2 kg)  08/26/21 200 lb 12.8 oz (91.1 kg)       Jeanie Sewer, NP

## 2022-10-12 NOTE — Patient Instructions (Signed)
It was very nice to see you today!   As discussed, increase your Amlodipine to '5mg'$  twice a day. Continue the HCTZ every morning. I have sent a refill for your Crestor, take daily.  Happy new year!      PLEASE NOTE:  If you had any lab tests please let us know if you have not heard back within a few days. You may see your results on MyChart before we have a chance to review them but we will give you a call once they are reviewed by [place]. If we ordered any referrals today, please let us know if you have not heard from their office within the next week.

## 2022-10-27 ENCOUNTER — Ambulatory Visit (INDEPENDENT_AMBULATORY_CARE_PROVIDER_SITE_OTHER): Payer: Medicare Other

## 2022-10-27 VITALS — Wt 192.0 lb

## 2022-10-27 DIAGNOSIS — Z0000 Encounter for general adult medical examination without abnormal findings: Secondary | ICD-10-CM

## 2022-10-27 NOTE — Patient Instructions (Signed)
[name] , Thank you for taking time to come for your Medicare Wellness Visit. I appreciate your ongoing commitment to your health goals. Please review the following plan we discussed and let me know if I can assist you in the future.   These are the goals we discussed:  Goals      Patient Stated     Lose some weight         This is a list of the screening recommended for you and due dates:  Health Maintenance  Topic Date Due   Pneumonia Vaccine (2 - PCV) 02/07/2021   COVID-19 Vaccine (4 - 2023-24 season) 08/25/2022   Mammogram  10/28/2023*   DEXA scan (bone density measurement)  10/28/2023*   Colon Cancer Screening  10/28/2023*   Medicare Annual Wellness Visit  10/28/2023   Flu Shot  Completed   Hepatitis C Screening: USPSTF Recommendation to screen - Ages 18-79 yo.  Completed   Zoster (Shingles) Vaccine  Completed   HPV Vaccine  Aged Out   DTaP/Tdap/Td vaccine  Discontinued  *Topic was postponed. The date shown is not the original due date.    Advanced directives: Please bring a copy of your health care power of attorney and living will to the office at your convenience.  Conditions/risks identified: lose some weight   Next appointment: Follow up in one year for your annual wellness visit    Preventive Care 65 Years and Older, Female Preventive care refers to lifestyle choices and visits with your health care provider that can promote health and wellness. What does preventive care include? A yearly physical exam. This is also called an annual well check. Dental exams once or twice a year. Routine eye exams. Ask your health care provider how often you should have your eyes checked. Personal lifestyle choices, including: Daily care of your teeth and gums. Regular physical activity. Eating a healthy diet. Avoiding tobacco and drug use. Limiting alcohol use. Practicing safe sex. Taking low-dose aspirin every day. Taking vitamin and mineral supplements as recommended  by your health care provider. What happens during an annual well check? The services and screenings done by your health care provider during your annual well check will depend on your age, overall health, lifestyle risk factors, and family history of disease. Counseling  Your health care provider may ask you questions about your: Alcohol use. Tobacco use. Drug use. Emotional well-being. Home and relationship well-being. Sexual activity. Eating habits. History of falls. Memory and ability to understand (cognition). Work and work Statistician. Reproductive health. Screening  You may have the following tests or measurements: Height, weight, and BMI. Blood pressure. Lipid and cholesterol levels. These may be checked every 5 years, or more frequently if you are over 63 years old. Skin check. Lung cancer screening. You may have this screening every year starting at age 41 if you have a 30-pack-year history of smoking and currently smoke or have quit within the past 15 years. Fecal occult blood test (FOBT) of the stool. You may have this test every year starting at age 41. Flexible sigmoidoscopy or colonoscopy. You may have a sigmoidoscopy every 5 years or a colonoscopy every 10 years starting at age 62. Hepatitis C blood test. Hepatitis B blood test. Sexually transmitted disease (STD) testing. Diabetes screening. This is done by checking your blood sugar (glucose) after you have not eaten for a while (fasting). You may have this done every 1-3 years. Bone density scan. This is done to screen for osteoporosis.  You may have this done starting at age 55. Mammogram. This may be done every 1-2 years. Talk to your health care provider about how often you should have regular mammograms. Talk with your health care provider about your test results, treatment options, and if necessary, the need for more tests. Vaccines  Your health care provider may recommend certain vaccines, such as: Influenza  vaccine. This is recommended every year. Tetanus, diphtheria, and acellular pertussis (Tdap, Td) vaccine. You may need a Td booster every 10 years. Zoster vaccine. You may need this after age 24. Pneumococcal 13-valent conjugate (PCV13) vaccine. One dose is recommended after age 22. Pneumococcal polysaccharide (PPSV23) vaccine. One dose is recommended after age 27. Talk to your health care provider about which screenings and vaccines you need and how often you need them. This information is not intended to replace advice given to you by your health care provider. Make sure you discuss any questions you have with your health care provider. Document Released: 10/18/2015 Document Revised: 06/10/2016 Document Reviewed: 07/23/2015 Elsevier Interactive Patient Education  2017 [employer] Prevention in the Home Falls can cause injuries. They can happen to people of all ages. There are many things you can do to make your home safe and to help prevent falls. What can I do on the outside of my home? Regularly fix the edges of walkways and driveways and fix any cracks. Remove anything that might make you trip as you walk through a door, such as a raised step or threshold. Trim any bushes or trees on the path to your home. Use bright outdoor lighting. Clear any walking paths of anything that might make someone trip, such as rocks or tools. Regularly check to see if handrails are loose or broken. Make sure that both sides of any steps have handrails. Any raised decks and porches should have guardrails on the edges. Have any leaves, snow, or ice cleared regularly. Use sand or salt on walking paths during winter. Clean up any spills in your garage right away. This includes oil or grease spills. What can I do in the bathroom? Use night lights. Install grab bars by the toilet and in the tub and shower. Do not use towel bars as grab bars. Use non-skid mats or decals in the tub or shower. If you  need to sit down in the shower, use a plastic, non-slip stool. Keep the floor dry. Clean up any water that spills on the floor as soon as it happens. Remove soap buildup in the tub or shower regularly. Attach bath mats securely with double-sided non-slip rug tape. Do not have throw rugs and other things on the floor that can make you trip. What can I do in the bedroom? Use night lights. Make sure that you have a light by your bed that is easy to reach. Do not use any sheets or blankets that are too big for your bed. They should not hang down onto the floor. Have a firm chair that has side arms. You can use this for support while you get dressed. Do not have throw rugs and other things on the floor that can make you trip. What can I do in the kitchen? Clean up any spills right away. Avoid walking on wet floors. Keep items that you use a lot in easy-to-reach places. If you need to reach something above you, use a strong step stool that has a grab bar. Keep electrical cords out of the way. Do not use  floor polish or wax that makes floors slippery. If you must use wax, use non-skid floor wax. Do not have throw rugs and other things on the floor that can make you trip. What can I do with my stairs? Do not leave any items on the stairs. Make sure that there are handrails on both sides of the stairs and use them. Fix handrails that are broken or loose. Make sure that handrails are as long as the stairways. Check any carpeting to make sure that it is firmly attached to the stairs. Fix any carpet that is loose or worn. Avoid having throw rugs at the top or bottom of the stairs. If you do have throw rugs, attach them to the floor with carpet tape. Make sure that you have a light switch at the top of the stairs and the bottom of the stairs. If you do not have them, ask someone to add them for you. What else can I do to help prevent falls? Wear shoes that: Do not have high heels. Have rubber  bottoms. Are comfortable and fit you well. Are closed at the toe. Do not wear sandals. If you use a stepladder: Make sure that it is fully opened. Do not climb a closed stepladder. Make sure that both sides of the stepladder are locked into place. Ask someone to hold it for you, if possible. Clearly mark and make sure that you can see: Any grab bars or handrails. First and last steps. Where the edge of each step is. Use tools that help you move around (mobility aids) if they are needed. These include: Canes. Walkers. Scooters. Crutches. Turn on the lights when you go into a dark area. Replace any light bulbs as soon as they burn out. Set up your furniture so you have a clear path. Avoid moving your furniture around. If any of your floors are uneven, fix them. If there are any pets around you, be aware of where they are. Review your medicines with your doctor. Some medicines can make you feel dizzy. This can increase your chance of falling. Ask your doctor what other things that you can do to help prevent falls. This information is not intended to replace advice given to you by your health care provider. Make sure you discuss any questions you have with your health care provider. Document Released: 07/18/2009 Document Revised: 02/27/2016 Document Reviewed: 10/26/2014 Elsevier Interactive Patient Education  2017 [employer].

## 2022-10-27 NOTE — Progress Notes (Signed)
I connected with  Sonya Woods on 10/27/22 by a audio enabled telemedicine application and verified that I am speaking with the correct person using two identifiers.  Patient Location: Home  Provider Location: Office/Clinic  I discussed the limitations of evaluation and management by telemedicine. The patient expressed understanding and agreed to proceed.   Subjective:   Sonya Woods is a 72 y.o. female who presents for an Initial Medicare Annual Wellness Visit.  Review of Systems     Cardiac Risk Factors include: advanced age (>83mn, >>10women);dyslipidemia;obesity (BMI >30kg/m2);hypertension     Objective:    Today's Vitals   10/27/22 1031  Weight: 192 lb (87.1 kg)   Body mass index is 30.07 kg/m.     10/27/2022   10:35 AM 07/24/2021   10:18 AM  Advanced Directives  Does Patient Have a Medical Advance Directive? Yes No  Type of [place]aramed[place]of [place] will   Copy of [place] Chart? No - copy requested   Would patient like information on creating a medical advance directive?  No - Patient declined    Current Medications (verified) Outpatient Encounter Medications as of 10/27/2022  Medication Sig   acetaminophen (TYLENOL) 500 MG tablet Take 1,000 mg by mouth every 6 (six) hours as needed for mild pain.   amLODipine (NORVASC) 5 MG tablet Take 1 tablet (5 mg total) by mouth 2 (two) times daily.   B Complex-C (B-COMPLEX WITH VITAMIN C) tablet Take 1 tablet by mouth daily.   cholecalciferol (VITAMIN D) 1000 units tablet Take 2,000 Units by mouth daily.   Coenzyme Q10 (CO Q 10 PO) Take 200 mg by mouth.   hydrochlorothiazide (HYDRODIURIL) 12.5 MG tablet Take 1 tablet (12.5 mg total) by mouth daily.   rosuvastatin (CRESTOR) 5 MG tablet Take 1 pill daily.   No facility-administered encounter medications on file as of 10/27/2022.    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 No side effects. Will continue and recheck in 4 months at physical.    Past Surgical History:  Procedure Laterality Date   APPENDECTOMY     BIOPSY  07/26/2021   Procedure: BIOPSY;  Surgeon: AYetta Flock MD;  [facility];  Service: Gastroenterology;;   CATARACT EXTRACTION Right    [place] 07/26/2021   Procedure: FLEXIBLE SIGMOIDOSCOPY;  Surgeon: AYetta Flock MD;  Location[facility];  Service: Gastroenterology;  Laterality: N/A;   MOHS SURGERY  06/2017   nose    S/P Bone Tumor Surgery      Family History  Problem Relation Age of Onset   Dementia Mother    Mental illness Mother  Hypertension Father    Hyperlipidemia Father    Heart attack Father    Social History   Socioeconomic History   Marital status: Divorced    Spouse name: Not on file   Number of children: Not on file   Years of education: Not on file   Highest education level: High school graduate  Occupational History   Occupation: Accountant  Tobacco Use   Smoking status: Former    Types: Cigarettes    Quit date: 08/03/2011    Years since quitting: 11.2   Smokeless tobacco: Never  Vaping Use   Vaping Use: Never used  Substance and Sexual Activity    Alcohol use: Never   Drug use: Never   Sexual activity: Not Currently    Partners: Male  Other Topics Concern   Not on file  Social History Narrative   Not on file   Social Determinants of Health   Financial Resource Strain: Low Risk  (10/27/2022)   Overall Financial Resource Strain (CARDIA)    Difficulty of Paying Living Expenses: Not hard at all  Food Insecurity: No Food Insecurity (10/27/2022)   Hunger Vital Sign    Worried About Running Out of Food in the Last Year: Never true    [place] in the Last Year: Never true  Transportation Needs: No Transportation Needs (10/27/2022)   PRAPARE - Hydrologist (Medical): No    Lack of Transportation (Non-Medical): No  Physical Activity: Insufficiently Active (10/27/2022)   Exercise Vital Sign    Days of Exercise per Week: 3 days    Minutes of Exercise per Session: 20 min  Stress: No Stress Concern Present (10/27/2022)   [place]    Feeling of Stress : Not at all  Social Connections: Socially Isolated (10/27/2022)   Social Connection and Isolation Panel [NHANES]    Frequency of Communication with Friends and Family: More than three times a week    Frequency of Social Gatherings with Friends and Family: Twice a week    Attends Religious Services: Never    Marine scientist or Organizations: No    Attends Music therapist: Never    Marital Status: Divorced    Tobacco Counseling Counseling given: Not Answered   Clinical Intake:  Pre-visit preparation completed: Yes  Pain : No/denies pain     BMI - recorded: 30.07 Nutritional Status: BMI > 30  Obese Nutritional Risks: None Diabetes: No  How often do you need to have someone help you when you read instructions, pamphlets, or other written materials from your doctor or pharmacy?: 1 - Never  Diabetic?no  Interpreter Needed?: No  Information entered by ::  Charlott Rakes, LPN   Activities of Daily Living    10/27/2022   10:36 AM  In your present state of health, do you have any difficulty performing the following activities:  Hearing? 0  Vision? 0  Difficulty concentrating or making decisions? 0  Walking or climbing stairs? 0  Dressing or bathing? 0  Doing errands, shopping? 0  Preparing Food and eating ? N  Using the Toilet? N  In the past six months, have you accidently leaked urine? N  Do you have problems with loss of bowel control? N  Managing your Medications? N  Managing your Finances? N  Housekeeping or managing your Housekeeping? N    Patient Care Team: Jeanie Sewer, NP as PCP - General (Family Medicine)  Indicate any recent Medical Services you may have received from other than [facility] providers in the past year (date may be approximate).     Assessment:   This is a routine wellness examination for Sonya Woods.  Hearing/Vision screen Hearing Screening - Comments:: Pt denies any hearing issues  Vision Screening - Comments:: Pt encouraged for annual eye exams   Dietary issues and exercise activities discussed: Current Exercise Habits: Home exercise routine, Type of exercise: walking, Time (Minutes): 20, Frequency (Times/Week): 3, Weekly Exercise (Minutes/Week): 60   Goals Addressed             This Visit's Progress    Patient Stated       Lose some weight        Depression Screen    10/27/2022   10:32 AM 10/12/2022    9:56 AM 08/26/2021   10:04 AM 03/11/2020    8:58 AM 02/08/2020    1:14 PM 08/02/2018   11:03 AM  PHQ 2/9 Scores  PHQ - 2 Score 0 0 0 0 0 0    Fall Risk    10/27/2022   10:36 AM 10/12/2022    9:56 AM 08/26/2021   10:04 AM 03/11/2020    8:57 AM 02/08/2020    1:14 PM  Fall Risk   Falls in the past year? 0 0 0 0 0  Number falls in past yr: 0 0     Injury with Fall? 0 0     Risk for fall due to : Impaired vision No Fall Risks  No Fall Risks   Follow up Falls prevention discussed Education  provided       FALL RISK PREVENTION PERTAINING TO THE HOME:  Any stairs in or around the home? Yes  If so, are there any without handrails? No  Home free of loose throw rugs in walkways, pet beds, electrical cords, etc? Yes  Adequate lighting in your home to reduce risk of falls? Yes   ASSISTIVE DEVICES UTILIZED TO PREVENT FALLS:  Life alert? No  Use of a cane, walker or w/c? No  Grab bars in the bathroom? No  Shower chair or bench in shower? No  Elevated toilet seat or a handicapped toilet? No   TIMED UP AND GO:  Was the test performed? No .  Cognitive Function:        10/27/2022   10:37 AM  6CIT Screen  What Year? 0 points  What month? 0 points  What time? 0 points  Count back from 20 0 points  Months in reverse 0 points  Repeat phrase 0 points  Total Score 0 points    Immunizations Immunization History  Administered Date(s) Administered   COVID-19, mRNA, vaccine(Comirnaty)12 years and older 06/30/2022   Fluad Quad(high Dose 65+) 08/26/2021   Influenza, High Dose Seasonal PF 07/08/2018, 05/31/2019   Influenza-Unspecified 06/30/2022   PFIZER(Purple Top)SARS-COV-2 Vaccination 11/16/2019, 12/11/2019   Pneumococcal Polysaccharide-23 02/08/2020   RSV,unspecified 06/30/2022   Zoster Recombinat (Shingrix) 10/24/2021, 01/05/2022      Flu Vaccine status: Up to date  Pneumococcal vaccine status: Due, Education has been provided regarding the importance of this vaccine. Advised may receive this vaccine at local pharmacy or Health Dept. Aware to provide a copy of the vaccination record if obtained from local pharmacy or Health Dept. Verbalized acceptance and understanding.  Covid-19 vaccine status: Completed vaccines  Qualifies for Shingles Vaccine? Yes   Zostavax completed Yes   Shingrix Completed?: Yes  Screening Tests Health Maintenance  Topic Date  Due   Pneumonia Vaccine 72+ Years old (2 - PCV) 02/07/2021   COVID-19 Vaccine (4 - 2023-24 season) 08/25/2022    MAMMOGRAM  10/28/2023 (Originally 05/24/2000)   DEXA SCAN  10/28/2023 (Originally 05/25/2015)   COLONOSCOPY (Pts 45-81yr Insurance coverage will need to be confirmed)  10/28/2023 (Originally 05/25/1995)   Medicare Annual Wellness (AWV)  10/28/2023   INFLUENZA VACCINE  Completed   Hepatitis C Screening  Completed   Zoster Vaccines- Shingrix  Completed   HPV VACCINES  Aged Out   DTaP/Tdap/Td  Discontinued    Health Maintenance  Health Maintenance Due  Topic Date Due   Pneumonia Vaccine 72 Years old (2 - PCV) 02/07/2021   COVID-19 Vaccine (4 - 2023-24 season) 08/25/2022    Colorectal cancer screening: No longer required. Per pt   Mammogram status: No longer required due to per pt .     Additional Screening:  Hepatitis C Screening:  Completed 02/02/18  Vision Screening: Recommended annual ophthalmology exams for early detection of glaucoma and other disorders of the eye. Is the patient up to date with their annual eye exam?  No  Who is the provider or what is the name of the office in which the patient attends annual eye exams? Encouraged to follow up with a provider  If pt is not established with a provider, would they like to be referred to a provider to establish care? No .   Dental Screening: Recommended annual dental exams for proper oral hygiene  Community Resource Referral / Chronic Care Management: CRR required this visit?  No   CCM required this visit?  No      Plan:     I have personally reviewed and noted the following in the patient's chart:   Medical and social history Use of alcohol, tobacco or illicit drugs  Current medications and supplements including opioid prescriptions. Patient is not currently taking opioid prescriptions. Functional ability and status Nutritional status Physical activity Advanced directives List of other physicians Hospitalizations, surgeries, and ER visits in previous 12 months Vitals Screenings to include cognitive,  depression, and falls Referrals and appointments  In addition, I have reviewed and discussed with patient certain preventive protocols, quality metrics, and best practice recommendations. A written personalized care plan for preventive services as well as general preventive health recommendations were provided to patient.     TWillette Brace LPN   16/29/4765  Nurse Notes: none

## 2023-02-10 ENCOUNTER — Ambulatory Visit (INDEPENDENT_AMBULATORY_CARE_PROVIDER_SITE_OTHER): Payer: Medicare Other | Admitting: Family

## 2023-02-10 VITALS — BP 128/75 | HR 65 | Temp 96.7°F | Ht 67.0 in | Wt 189.4 lb

## 2023-02-10 DIAGNOSIS — I10 Essential (primary) hypertension: Secondary | ICD-10-CM

## 2023-02-10 MED ORDER — LISINOPRIL-HYDROCHLOROTHIAZIDE 10-12.5 MG PO TABS
1.0000 | ORAL_TABLET | Freq: Every morning | ORAL | 2 refills | Status: DC
Start: 2023-02-10 — End: 2023-04-15

## 2023-02-10 MED ORDER — LISINOPRIL-HYDROCHLOROTHIAZIDE 10-12.5 MG PO TABS
1.0000 | ORAL_TABLET | Freq: Every morning | ORAL | 2 refills | Status: DC
Start: 2023-02-10 — End: 2023-02-10

## 2023-02-10 NOTE — Assessment & Plan Note (Signed)
chronic taking Amlodipine 5mg  bid & HCTZ 12.5mg  qam advised last visit to increase Amlodipine to bid and decrease HCTZ to 12.5mg  qd BP good today, but pt states she is still having lower leg edema advised to decrease Amlodipine to 5mg  every evening, stop HCTZ, sending Lisinopril 10mg -HCTZ 12.5mg  qam f/u 1-2 months

## 2023-02-10 NOTE — Progress Notes (Signed)
Patient ID: Sonya Woods, female    DOB: [birth year], 72 y.o.   MRN: [id number]  Chief Complaint  Patient presents with   Leg Swelling    Pt c/o bilateral leg swelling for about a month, Currently taking hydrochlorothiazide. Has tried elevating legs at night.     HPI:      Hypertension: Patient is currently maintained on the following medications for blood pressure: Amlodipine 5mg  bid, HCTZ 12.5mg . Patient reports good compliance with blood pressure medications. Patient denies chest pain, shortness of breath. But she has had increased leg/ankle swelling since increasing the Amlodipine. Last 3 blood pressure readings in our office are as follows: BP Readings from Last 3 Encounters:  02/10/23 128/75  10/12/22 (!) 157/71  08/04/22 (!) 144/75    Assessment & Plan:  Essential hypertension Assessment & Plan: chronic taking Amlodipine 5mg  bid & HCTZ 12.5mg  qam advised last visit to increase Amlodipine to bid and decrease HCTZ to 12.5mg  qd BP good today, but pt states she is still having lower leg edema advised to decrease Amlodipine to 5mg  every evening, stop HCTZ, sending Lisinopril 10mg -HCTZ 12.5mg  qam f/u 1-2 months  Orders: -     Lisinopril-hydroCHLOROthiazide; Take 1 tablet by mouth in the morning.  Dispense: 30 tablet; Refill: 2   Subjective:    Outpatient Medications Prior to Visit  Medication Sig Dispense Refill   acetaminophen (TYLENOL) 500 MG tablet Take 1,000 mg by mouth every 6 (six) hours as needed for mild pain.     amLODipine (NORVASC) 5 MG tablet Take 1 tablet (5 mg total) by mouth 2 (two) times daily. 180 tablet 1   B Complex-C (B-COMPLEX WITH VITAMIN C) tablet Take 1 tablet by mouth daily.     cholecalciferol (VITAMIN D) 1000 units tablet Take 2,000 Units by mouth daily.     Coenzyme Q10 (CO Q 10 PO) Take 200 mg by mouth.     hydrochlorothiazide (HYDRODIURIL) 12.5 MG tablet Take 1 tablet (12.5 mg total) by mouth daily. 90 tablet 3   rosuvastatin (CRESTOR) 5 MG  tablet Take 1 pill daily. 90 tablet 1   No facility-administered medications prior to visit.   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 No side effects. Will continue and recheck in 4 months at physical.    Past Surgical History:  Procedure Laterality Date   APPENDECTOMY     BIOPSY  07/26/2021   Procedure: BIOPSY;  Surgeon: Benancio Deeds, MD;  Location: [facility];  Service: Gastroenterology;;   CATARACT EXTRACTION Right    CESAREAN SECTION  1976   FLEXIBLE SIGMOIDOSCOPY N/A 07/26/2021   Procedure: FLEXIBLE SIGMOIDOSCOPY;  Surgeon: Benancio Deeds, MD;  Location: [facility];  Service: Gastroenterology;  Laterality: N/A;   MOHS SURGERY  06/2017   nose    S/P Bone  Tumor Surgery      Allergies  Allergen Reactions   Angiotensin Receptor Blockers    Losartan Potassium-Hctz    Tetanus Toxoids    Thiazide-Type Diuretics    Tramadol Other (See Comments)      Objective:    Physical Exam Vitals and nursing note reviewed.  Constitutional:      Appearance: Normal appearance.  Cardiovascular:     Rate and Rhythm: Normal rate and regular rhythm.  Pulmonary:     Effort: Pulmonary effort is normal.     Breath sounds: Normal breath sounds.  Musculoskeletal:        General: Normal range of motion.  Skin:    General: Skin is warm and dry.  Neurological:     Mental Status: She is alert.   Psychiatric:        Mood and Affect: Mood normal.        Behavior: Behavior normal.    BP 128/75 (BP Location: Left Arm, Patient Position: Sitting, Cuff Size: Large)   Pulse 65   Temp (!) 96.7 F (35.9 C) (Temporal)   Ht 5\' 7"  (1.702 m)   Wt 189 lb 6.4 oz (85.9 kg)   SpO2 97%   BMI 29.66 kg/m  Wt Readings from Last 3 Encounters:  02/10/23 189 lb 6.4 oz (85.9 kg)  10/27/22 192 lb (87.1 kg)  10/12/22 192 lb 6.4 oz (87.3 kg)      Dulce Sellar, NP

## 2023-03-01 ENCOUNTER — Emergency Department (HOSPITAL_COMMUNITY)
Admission: EM | Admit: 2023-03-01 | Discharge: 2023-03-03 | Disposition: A | Discharge status: Home / Self Care | Payer: Medicare Other | Attending: Emergency Medicine | Admitting: Emergency Medicine

## 2023-03-01 ENCOUNTER — Emergency Department (HOSPITAL_COMMUNITY): Payer: Medicare Other

## 2023-03-01 ENCOUNTER — Encounter (HOSPITAL_COMMUNITY): Payer: Self-pay

## 2023-03-01 ENCOUNTER — Other Ambulatory Visit: Payer: Self-pay

## 2023-03-01 DIAGNOSIS — W182XXA Fall in (into) shower or empty bathtub, initial encounter: Secondary | ICD-10-CM | POA: Insufficient documentation

## 2023-03-01 DIAGNOSIS — R7303 Prediabetes: Secondary | ICD-10-CM | POA: Diagnosis not present

## 2023-03-01 DIAGNOSIS — Z79899 Other long term (current) drug therapy: Secondary | ICD-10-CM | POA: Insufficient documentation

## 2023-03-01 DIAGNOSIS — R296 Repeated falls: Secondary | ICD-10-CM

## 2023-03-01 DIAGNOSIS — R2689 Other abnormalities of gait and mobility: Secondary | ICD-10-CM | POA: Insufficient documentation

## 2023-03-01 DIAGNOSIS — M6281 Muscle weakness (generalized): Secondary | ICD-10-CM | POA: Insufficient documentation

## 2023-03-01 DIAGNOSIS — Y92002 Bathroom of unspecified non-institutional (private) residence single-family (private) house as the place of occurrence of the external cause: Secondary | ICD-10-CM | POA: Diagnosis not present

## 2023-03-01 DIAGNOSIS — R944 Abnormal results of kidney function studies: Secondary | ICD-10-CM | POA: Insufficient documentation

## 2023-03-01 DIAGNOSIS — D72829 Elevated white blood cell count, unspecified: Secondary | ICD-10-CM | POA: Diagnosis not present

## 2023-03-01 DIAGNOSIS — Z87891 Personal history of nicotine dependence: Secondary | ICD-10-CM | POA: Insufficient documentation

## 2023-03-01 DIAGNOSIS — S32010A Wedge compression fracture of first lumbar vertebra, initial encounter for closed fracture: Secondary | ICD-10-CM | POA: Diagnosis not present

## 2023-03-01 DIAGNOSIS — W19XXXA Unspecified fall, initial encounter: Secondary | ICD-10-CM

## 2023-03-01 DIAGNOSIS — N179 Acute kidney failure, unspecified: Secondary | ICD-10-CM | POA: Insufficient documentation

## 2023-03-01 DIAGNOSIS — M545 Low back pain, unspecified: Secondary | ICD-10-CM | POA: Insufficient documentation

## 2023-03-01 DIAGNOSIS — S3992XA Unspecified injury of lower back, initial encounter: Secondary | ICD-10-CM | POA: Diagnosis present

## 2023-03-01 DIAGNOSIS — I10 Essential (primary) hypertension: Secondary | ICD-10-CM | POA: Diagnosis not present

## 2023-03-01 HISTORY — DX: Repeated falls: R29.6

## 2023-03-01 HISTORY — DX: Wedge compression fracture of first lumbar vertebra, initial encounter for closed fracture: S32.010A

## 2023-03-01 LAB — CBC WITH DIFFERENTIAL/PLATELET
Abs Immature Granulocytes: 0.07 10*3/uL (ref 0.00–0.07)
Basophils Absolute: 0 10*3/uL (ref 0.0–0.1)
Basophils Relative: 0 %
Eosinophils Absolute: 0 10*3/uL (ref 0.0–0.5)
Eosinophils Relative: 0 %
HCT: 40 % (ref 36.0–46.0)
Hemoglobin: 13.1 g/dL (ref 12.0–15.0)
Immature Granulocytes: 1 %
Lymphocytes Relative: 5 %
Lymphs Abs: 0.7 10*3/uL (ref 0.7–4.0)
MCH: 29 pg (ref 26.0–34.0)
MCHC: 32.8 g/dL (ref 30.0–36.0)
MCV: 88.7 fL (ref 80.0–100.0)
Monocytes Absolute: 0.6 10*3/uL (ref 0.1–1.0)
Monocytes Relative: 4 %
Neutro Abs: 12.9 10*3/uL — ABNORMAL HIGH (ref 1.7–7.7)
Neutrophils Relative %: 90 %
Platelets: 196 10*3/uL (ref 150–400)
RBC: 4.51 MIL/uL (ref 3.87–5.11)
RDW: 13.6 % (ref 11.5–15.5)
WBC: 14.3 10*3/uL — ABNORMAL HIGH (ref 4.0–10.5)
nRBC: 0 % (ref 0.0–0.2)

## 2023-03-01 LAB — COMPREHENSIVE METABOLIC PANEL
ALT: 18 U/L (ref 0–44)
AST: 28 U/L (ref 15–41)
Albumin: 3.9 g/dL (ref 3.5–5.0)
Alkaline Phosphatase: 75 U/L (ref 38–126)
Anion gap: 14 (ref 5–15)
BUN: 27 mg/dL — ABNORMAL HIGH (ref 8–23)
CO2: 24 mmol/L (ref 22–32)
Calcium: 9 mg/dL (ref 8.9–10.3)
Chloride: 101 mmol/L (ref 98–111)
Creatinine, Ser: 1.34 mg/dL — ABNORMAL HIGH (ref 0.44–1.00)
GFR, Estimated: 42 mL/min — ABNORMAL LOW (ref >60)
Glucose, Bld: 143 mg/dL — ABNORMAL HIGH (ref 70–99)
Potassium: 3.9 mmol/L (ref 3.5–5.1)
Sodium: 139 mmol/L (ref 135–145)
Total Bilirubin: 1.1 mg/dL (ref 0.3–1.2)
Total Protein: 7.3 g/dL (ref 6.5–8.1)

## 2023-03-01 LAB — CK: Total CK: 95 U/L (ref 38–234)

## 2023-03-01 MED ORDER — ROSUVASTATIN CALCIUM 5 MG PO TABS
5.0000 mg | ORAL_TABLET | Freq: Every day | ORAL | Status: DC
  Administered 2023-03-02 – 2023-03-03 (×2): 5 mg via ORAL
  Filled 2023-03-01 (×2): qty 1

## 2023-03-01 MED ORDER — AMLODIPINE BESYLATE 5 MG PO TABS
5.0000 mg | ORAL_TABLET | Freq: Every day | ORAL | Status: DC
  Administered 2023-03-02 – 2023-03-03 (×2): 5 mg via ORAL
  Filled 2023-03-01 (×2): qty 1

## 2023-03-01 MED ORDER — LIDOCAINE 5 % EX PTCH
1.0000 | MEDICATED_PATCH | CUTANEOUS | Status: DC
  Administered 2023-03-01 – 2023-03-02 (×2): 1 via TRANSDERMAL
  Filled 2023-03-01 (×2): qty 1

## 2023-03-01 MED ORDER — OXYCODONE HCL 5 MG PO TABS
5.0000 mg | ORAL_TABLET | ORAL | Status: DC | PRN
  Administered 2023-03-02 – 2023-03-03 (×8): 5 mg via ORAL
  Filled 2023-03-01 (×8): qty 1

## 2023-03-01 MED ORDER — LACTATED RINGERS IV BOLUS
500.0000 mL | Freq: Once | INTRAVENOUS | Status: AC
  Administered 2023-03-01: 500 mL via INTRAVENOUS

## 2023-03-01 MED ORDER — OXYCODONE-ACETAMINOPHEN 5-325 MG PO TABS
1.0000 | ORAL_TABLET | Freq: Once | ORAL | Status: AC
  Administered 2023-03-01: 1 via ORAL
  Filled 2023-03-01: qty 1

## 2023-03-01 MED ORDER — HYDROMORPHONE HCL 1 MG/ML IJ SOLN
0.5000 mg | Freq: Once | INTRAMUSCULAR | Status: AC
  Administered 2023-03-01: 0.5 mg via INTRAVENOUS
  Filled 2023-03-01: qty 1

## 2023-03-01 MED ORDER — ACETAMINOPHEN 500 MG PO TABS
1000.0000 mg | ORAL_TABLET | Freq: Three times a day (TID) | ORAL | Status: DC
  Administered 2023-03-01 – 2023-03-03 (×6): 1000 mg via ORAL
  Filled 2023-03-01 (×6): qty 2

## 2023-03-01 NOTE — ED Triage Notes (Signed)
Pt BIB EMS for a fall this morning, fell this morning getting out of the shower, foot hit the side of the tub, fell hit her lower back and laid in the floor for about 3 hours. No thinners, no LOC.  Did ambulate at home with a walker, is baseline.   152/80 HR 76 Rest 18 O2 98

## 2023-03-01 NOTE — ED Provider Notes (Signed)
11:28 PM Assumed care of patient from off-going team. For more details, please see note from same day.  In brief, this is a 72 y.o. female w/ fall, back pain. On ground for 3-4 hours. Ambulatory here in the department.  Plan/Dispo at time of sign-out & ED Course since sign-out: [ ]  labs, CT lumbar spine  BP (!) 140/50   Pulse 66   Temp 98.2 F (36.8 C) (Oral)   Resp 20   Ht 5\' 7"  (1.702 m)   Wt 81.6 kg   SpO2 98%   BMI 28.19 kg/m    ED Course:   Clinical Course as of 03/01/23 2328  Mon Mar 01, 2023  1619 Signed out pending CT scan and labs.  [WS]  1729 CT Lumbar Spine Wo Contrast 1. Acute L1 superior endplate compression deformity with approximately 25% loss of height. Slight buckling of posterior cortex but no significant mass effect on the canal. 2. Prominent L4-L5 degenerative changes with chronic anterolisthesis. There is spinal canal and neural foraminal stenosis at this level. Lesser degenerative changes at L5-S1. [HN]  1734 Consulted to NSGY, ordered TLSO brace, still pending labs [HN]  1752 NSGY Dr. Danielle Dess will f/u on o/p basis, recommends TLSO [HN]  1753 WBC(!): 14.3 +Leukocytosis [HN]  1827 Creatinine(!): 1.34 +AKI [HN]  1840 Pt reevaluated. She states she is in 10/10 pain after the percocet and is unable to walk. She lives alone and does not think she can care for herself at home. Will give IV pain meds and reassess. [HN]  2324 Patient reevaluated after several doses of pain medicine. She states she does not feel safe to go home. She has no neurologic symptoms but feels that her pain is too severe. She lives at home alone and cannot walk well by herself. Will consult PT, OT, and TOC for evaluation and possible placement.  [HN]  2327 Home meds ordered as well as tylenol q8h, oxycodone 5mg  q4h PRN. All questions answered to patient's satisfaction. [HN]    Clinical Course User Index [HN] Loetta Rough, MD [WS] [name], MD    Dispo: TOC  boarder ------------------------------- Vivi Barrack, MD Emergency Medicine  This note was created using dictation software, which may contain spelling or grammatical errors.   Loetta Rough, MD 03/01/23 [phone]

## 2023-03-01 NOTE — ED Provider Notes (Signed)
[place] EMERGENCY DEPARTMENT AT [facility] Provider Note  CSN: [id number] Arrival date & time: 03/01/23 1427  Chief Complaint(s) Fall (Low back pain)  HPI Sonya Woods is a 72 y.o. female with history of chronic back pain, hypertension presenting to the emergency department with back pain.  Patient reports she was in the bathroom, getting up from the toilet when she developed back pain and fell down.  She reports that this was similar to previous episodes of back pain.  She adamantly denies hitting her head or neck or her extremities.  She thinks she fell on her bottom and then on her back.  She was not able to get up on her own but was able to eventually get to the phone after a few hours and call for help.  On paramedics arrival they were able to help her up and she was able to ambulate with her walker.  She reports primarily she just has low back pain.  No numbness or tingling, fevers or chills.  No headaches, nausea or vomiting.  No bowel or bladder incontinence.   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 No side effects. Will continue and recheck in 4 months at physical.    Patient Active Problem List   Diagnosis Date Noted   History of lower GI bleeding 08/26/2021   Pain due to onychomycosis of toenails of both feet 01/27/2021   Cardiovascular risk factor > 12%  01/16/2019   Essential hypertension 08/03/2018   Pure hypercholesterolemia 08/03/2018   Mammogram declined 08/03/2018   Home Medication(s) Prior to Admission medications   Medication Sig Start Date End Date Taking? Authorizing Provider  acetaminophen (TYLENOL) 500 MG tablet Take 1,000 mg by mouth every 6 (six) hours as needed for mild pain.    [provider]  amLODipine (NORVASC) 5 MG tablet Take 1 tablet (5 mg total) by mouth 2 (two) times daily. 10/12/22   Dulce Sellar, NP  B Complex-C (B-COMPLEX WITH VITAMIN C) tablet Take 1 tablet by mouth daily.    [provider]  cholecalciferol (VITAMIN D) 1000 units tablet Take 2,000 Units by mouth daily.    [provider]  Coenzyme Q10 (CO Q 10 PO) Take 200 mg by mouth.    [provider]  lisinopril-hydrochlorothiazide (ZESTORETIC) 10-12.5 MG tablet Take 1 tablet by mouth in the morning. 02/10/23   Dulce Sellar, NP  rosuvastatin (CRESTOR) 5 MG tablet Take 1 pill daily. 10/12/22   Dulce Sellar, NP  Past Surgical History Past Surgical History:  Procedure Laterality Date   APPENDECTOMY     BIOPSY  07/26/2021   Procedure: BIOPSY;  Surgeon: Benancio Deeds, MD;  Location: [facility];  Service: Gastroenterology;;   CATARACT EXTRACTION Right    CESAREAN SECTION  1976   FLEXIBLE SIGMOIDOSCOPY N/A 07/26/2021   Procedure: FLEXIBLE SIGMOIDOSCOPY;  Surgeon: Benancio Deeds, MD;  Location: [facility];  Service: Gastroenterology;  Laterality: N/A;   MOHS SURGERY  06/2017   nose    S/P Bone Tumor Surgery      Family History Family History  Problem Relation Age of Onset   Dementia Mother    Mental illness Mother    Hypertension Father    Hyperlipidemia Father    Heart attack Father     Social History Social History   Tobacco Use   Smoking status: Former     Types: Cigarettes    Quit date: 08/03/2011    Years since quitting: 11.5   Smokeless tobacco: Never  Vaping Use   Vaping Use: Never used  Substance Use Topics   Alcohol use: Never   Drug use: Never   Allergies Angiotensin receptor blockers, Losartan potassium-hctz, Tetanus toxoids, and Tramadol  Review of Systems Review of Systems  All other systems reviewed and are negative.   Physical Exam Vital Signs  I have reviewed the triage vital signs BP (!) 161/48 (BP Location: Right Arm)   Pulse 76   Temp 98.1 F (36.7 C) (Oral)   Resp 12   Ht 5\' 7"  (1.702 m)   Wt 81.6 kg   SpO2 100%   BMI 28.19 kg/m  Physical Exam Vitals and nursing note reviewed.  Constitutional:      General: She is not in acute distress.    Appearance: She is well-developed. She is obese.  HENT:     Head: Normocephalic and atraumatic.     Mouth/Throat:     Mouth: Mucous membranes are moist.  Eyes:     Pupils: Pupils are equal, round, and reactive to light.  Cardiovascular:     Rate and Rhythm: Normal rate and regular rhythm.     Heart sounds: No murmur heard. Pulmonary:     Effort: Pulmonary effort is normal. No respiratory distress.     Breath sounds: Normal breath sounds.  Abdominal:     General: Abdomen is flat.     Palpations: Abdomen is soft.     Tenderness: There is no abdominal tenderness.  Musculoskeletal:        General: No tenderness.     Right lower leg: No edema.     Left lower leg: No edema.     Comments: No midline C,T spine tenderness. Mild low lumbar midline TTP w/o step off..  No chest wall tenderness or crepitus.  Full painless range of motion at the bilateral upper extremities including the shoulders, elbows, wrists, hand and fingers, and in the bilateral lower extremities including the hips, knees, ankle, toes.  No focal bony tenderness, injury or deformity.   Skin:    General: Skin is warm and dry.  Neurological:     General: No focal deficit present.     Mental  Status: She is alert. Mental status is at baseline.     Comments: Normal strength of bilateral lower extremities, no sensory deficit to light touch in the bilateral lower extremities.  Psychiatric:        Mood and Affect: Mood normal.  Behavior: Behavior normal.     ED Results and Treatments Labs (all labs ordered are listed, but only abnormal results are displayed) Labs Reviewed  COMPREHENSIVE METABOLIC PANEL  CBC WITH DIFFERENTIAL/PLATELET  CK                                                                                                                          Radiology No results found.  Pertinent labs & imaging results that were available during my care of the patient were reviewed by me and considered in my medical decision making (see MDM for details).  Medications Ordered in ED Medications  oxyCODONE-acetaminophen (PERCOCET/ROXICET) 5-325 MG per tablet 1 tablet (has no administration in time range)                                                                                                                                     Procedures Procedures  (including critical care time)  Medical Decision Making / ED Course   MDM:  72 year old female presenting to the emergency department with low back pain after fall onto bottom.  Patient well-appearing, has midline lumbar tenderness without signs of other traumatic injury on physical examination.  She has already been ambulatory since her fall.  Given focal tenderness, will obtain CT lumbar spine to further evaluate.  Will give pain medication.  Given the patient was on the ground, will check basic labs including CK.       Additional history obtained: -Additional history obtained from {wsadditionalhistorian:28072} -External records from outside source obtained and reviewed including: Chart review including previous notes, labs, imaging, consultation notes including ***   Lab Tests: -I ordered, reviewed,  and interpreted labs.   The pertinent results include:   Labs Reviewed  COMPREHENSIVE METABOLIC PANEL  CBC WITH DIFFERENTIAL/PLATELET  CK    Notable for ***  EKG   EKG Interpretation  Date/Time:    Ventricular Rate:    PR Interval:    QRS Duration:   QT Interval:    QTC Calculation:   R Axis:     Text Interpretation:           Imaging Studies ordered: I ordered imaging studies including *** On my interpretation imaging demonstrates *** I independently visualized and interpreted imaging. I agree with the radiologist interpretation   Medicines ordered and prescription drug management: Meds ordered this encounter  Medications   oxyCODONE-acetaminophen (PERCOCET/ROXICET)  5-325 MG per tablet 1 tablet    -I have reviewed the patients home medicines and have made adjustments as needed   Consultations Obtained: I requested consultation with the ***,  and discussed lab and imaging findings as well as pertinent plan - they recommend: ***   Cardiac Monitoring: The patient was maintained on a cardiac monitor.  I personally viewed and interpreted the cardiac monitored which showed an underlying rhythm of: ***  Social Determinants of Health:  Diagnosis or treatment significantly limited by social determinants of health: {wssoc:28071}   Reevaluation: After the interventions noted above, I reevaluated the patient and found that their symptoms have {resolved/improved/worsened:23923::"improved"}  Co morbidities that complicate the patient evaluation  Past Medical History:  Diagnosis Date   Abdominal pain    Benign tumor of right femur, s/p removal    Chicken pox    Chronic lower back pain    Colitis    Essential hypertension 08/03/2018   Former smoker, 100 pack year Hx, quit < 10 years ago    Fracture    left foot, right foot and left arm    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 No side effects. Will continue and recheck in 4 months at physical.       Dispostion: Disposition decision including need for hospitalization was considered, and patient {wsdispo:28070::"discharged from emergency department."}    Final Clinical Impression(s) / ED Diagnoses Final diagnoses:  None     This chart was dictated using voice recognition software.  Despite best efforts to proofread,  errors can occur which can change the documentation meaning.

## 2023-03-01 NOTE — Progress Notes (Signed)
Orthopedic Tech Progress Note Patient Details:  Sonya Woods [birth year] [id number]  Ortho Devices Type of Ortho Device: Thoracolumbar corset (TLSO) Ortho Device/Splint Location: Back Ortho Device/Splint Interventions: Ordered   Post Interventions Instructions Provided: Adjustment of device, Poper ambulation with device  Sonya Woods A Sonya Woods 03/01/2023, 6:06 PM

## 2023-03-02 ENCOUNTER — Telehealth: Payer: Self-pay | Admitting: Family

## 2023-03-02 ENCOUNTER — Encounter (HOSPITAL_COMMUNITY): Payer: Self-pay | Admitting: Emergency Medicine

## 2023-03-02 DIAGNOSIS — R296 Repeated falls: Secondary | ICD-10-CM

## 2023-03-02 NOTE — Progress Notes (Signed)
TOC CSW spoke with [employer].  Pt has been authorized to go to [facility].  Next review date is 03/04/2023.    Auth#:  [id number]  Charnita Trudel Tarpley-Carter, MSW, LCSW-A Pronouns:  She/Her/Hers [facility] Clinical Social Worker Direct Number:  [phone] [email or web address]

## 2023-03-02 NOTE — Discharge Planning (Signed)
RNCM consulted regarding safe discharge planning (Home with Home Health vs Skilled Nursing Facility Placement).  Physical Therapy evaluation placed; will follow up after recommendations from PT.     

## 2023-03-02 NOTE — Telephone Encounter (Signed)
Pt fell and broke her back. She is at [facility] and they are not letting her go home until she has a rehab place to help with her back. They are not helping her find a place and sister wants a call back to give her ideas on what places pt can go. Please advise.

## 2023-03-02 NOTE — Evaluation (Signed)
Physical Therapy Evaluation Patient Details Name: Sonya Woods MRN: [id number] DOB: April 19, 1951 Today's Date: 03/02/2023  History of Present Illness  72 y.o. female presents to [facility] hospital after fall preceded by back pain. CT shows acute L1 superior endplate compression fx. PMH includes colitis, HTN, hyperfibrinogenemia, measles.  Clinical Impression  Pt presents to PT with deficits in activity tolerance, gait, functional mobility, endurance, power. Pt is limited by back pain, reporting 10/10 pain during this session. Pt provides education on back precautions and log roll technique, pt reports improved comfort with bed mobility. Pt is able to ambulate for short distances but fatigues quickly at this time. Pt requires maxA to don brace, minA to doff. Pt reports no consistent caregiver support available at this time, only PRN from her sister. Due to assistance requirements fo brace management as well as poor activity tolerance for mobility and ADLs pt will benefit from short term inpatient PT services at the time of discharge.       Recommendations for follow up therapy are one component of a multi-disciplinary discharge planning process, led by the attending physician.  Recommendations may be updated based on patient status, additional functional criteria and insurance authorization.  Follow Up Recommendations Can patient physically be transported by private vehicle: Yes     Assistance Recommended at Discharge Frequent or constant Supervision/Assistance  Patient can return home with the following  A little help with walking and/or transfers;A lot of help with bathing/dressing/bathroom;Assistance with cooking/housework;Assist for transportation;Help with stairs or ramp for entrance    Equipment Recommendations BSC/3in1  Recommendations for Other Services       Functional Status Assessment Patient has had a recent decline in their functional status and demonstrates the ability to make  significant improvements in function in a reasonable and predictable amount of time.     Precautions / Restrictions Precautions Precautions: Fall;Back Precaution Booklet Issued: Yes (comment) Required Braces or Orthoses: Spinal Brace Spinal Brace: Thoracolumbosacral orthotic;Applied in sitting position Restrictions Weight Bearing Restrictions: No      Mobility  Bed Mobility Overal bed mobility: Needs Assistance Bed Mobility: Rolling, Sidelying to Sit, Sit to Sidelying Rolling: Min guard Sidelying to sit: Min guard     Sit to sidelying: Min guard General bed mobility comments: increased time, verbal cues for technique    Transfers Overall transfer level: Needs assistance Equipment used: Rolling walker (2 wheels) Transfers: Sit to/from Stand Sit to Stand: Min guard, From elevated surface                Ambulation/Gait Ambulation/Gait assistance: Min guard Gait Distance (Feet): 50 Feet Assistive device: Rolling walker (2 wheels) Gait Pattern/deviations: Step-through pattern Gait velocity: reduced Gait velocity interpretation: <1.8 ft/sec, indicate of risk for recurrent falls   General Gait Details: slowed step-through gait, 2 standing breaks due to fatigue  Stairs            Wheelchair Mobility    Modified Rankin (Stroke Patients Only)       Balance Overall balance assessment: Needs assistance Sitting-balance support: No upper extremity supported, Feet supported Sitting balance-Leahy Scale: Fair     Standing balance support: Bilateral upper extremity supported, Reliant on assistive device for balance Standing balance-Leahy Scale: Poor                               Pertinent Vitals/Pain Pain Assessment Pain Assessment: 0-10 Pain Score: 10-Worst pain ever Pain Location: low back Pain Descriptors / Indicators:  Aching Pain Intervention(s): Limited activity within patient's tolerance    Home Living Family/patient expects to be  discharged to:: Private residence Living Arrangements: Alone Available Help at Discharge: Family (sister PRN, inconsistent per pt) Type of Home: House Home Access: Stairs to enter Entrance Stairs-Rails: Right Entrance Stairs-Number of Steps: 3   Home Layout: One level Home Equipment: Agricultural consultant (2 wheels)      Prior Function Prior Level of Function : Independent/Modified Independent;Driving             Mobility Comments: ambulates with PRN use of RW       Hand Dominance        Extremity/Trunk Assessment   Upper Extremity Assessment Upper Extremity Assessment: Overall WFL for tasks assessed    Lower Extremity Assessment Lower Extremity Assessment: Generalized weakness    Cervical / Trunk Assessment Cervical / Trunk Assessment: Other exceptions Cervical / Trunk Exceptions: TLSO  Communication   Communication: No difficulties  Cognition Arousal/Alertness: Awake/alert Behavior During Therapy: WFL for tasks assessed/performed Overall Cognitive Status: Within Functional Limits for tasks assessed                                          General Comments General comments (skin integrity, edema, etc.): VSS on RA    Exercises     Assessment/Plan    PT Assessment Patient needs continued PT services  PT Problem List Decreased strength;Decreased activity tolerance;Decreased balance;Decreased mobility;Decreased knowledge of use of DME;Pain       PT Treatment Interventions Gait training;DME instruction;Stair training;Functional mobility training;Therapeutic activities;Therapeutic exercise;Balance training;Neuromuscular re-education;Patient/family education    PT Goals (Current goals can be found in the Care Plan section)  Acute Rehab PT Goals Patient Stated Goal: to reduce pain, return to independence PT Goal Formulation: With patient Time For Goal Achievement: 03/16/23 Potential to Achieve Goals: Fair    Frequency Min 3X/week      Co-evaluation               AM-PAC PT "6 Clicks" Mobility  Outcome Measure Help needed turning from your back to your side while in a flat bed without using bedrails?: A Little Help needed moving from lying on your back to sitting on the side of a flat bed without using bedrails?: A Little Help needed moving to and from a bed to a chair (including a wheelchair)?: A Little Help needed standing up from a chair using your arms (e.g., wheelchair or bedside chair)?: A Little Help needed to walk in hospital room?: A Little Help needed climbing 3-5 steps with a railing? : A Lot 6 Click Score: 17    End of Session Equipment Utilized During Treatment: Back brace Activity Tolerance: Patient limited by fatigue;Patient limited by pain Patient left: in bed;with call bell/phone within reach Nurse Communication: Mobility status PT Visit Diagnosis: Other abnormalities of gait and mobility (R26.89);Muscle weakness (generalized) (M62.81);Pain    Time: 0800-0825 PT Time Calculation (min) (ACUTE ONLY): 25 min   Charges:   PT Evaluation $PT Eval Low Complexity: 1 Low          Arlyss Gandy, PT, DPT Acute Rehabilitation Office [phone]   Arlyss Gandy 03/02/2023, 8:33 AM

## 2023-03-02 NOTE — Progress Notes (Signed)
TOC CSW spoke with Lawerance Cruel with admissions at [facility].  [facility] can receive pt in the morning.  Dvante Hands Tarpley-Carter, MSW, LCSW-A Pronouns:  She/Her/Hers [facility] of Care Clinical Social Worker Direct Number:  [phone] [email or web address]

## 2023-03-02 NOTE — ED Notes (Signed)
Patient up to the commode

## 2023-03-02 NOTE — Progress Notes (Signed)
Pt and family has accept [facility]'s bed offers.  CSW will start ins auth.  Chandel Zaun Tarpley-Carter, MSW, LCSW-A Pronouns:  She/Her/Hers [facility] Health[facility]itions of Care Clinical Social Worker Direct Number:  [phone] [email or web address]

## 2023-03-02 NOTE — TOC Initial Note (Signed)
Transition of Care [facility]) - Initial/Assessment Note    Patient Details  Name: Sonya Woods MRN: [id number] Date of Birth: Jul 08, 1951  Transition of [facility]) CM/SW Contact:    Susa Simmonds, LCSWA Phone Number: 03/02/2023, 1:51 PM  Clinical Narrative: CSW spoke with patient who agrees with the recommendations of skilled nursing for rehab. Patient stated she has never been to one and hasn't heard of any in the area. CSW told CSW she would send off the referrals today and CSW can go over the offers with her. Patient agreed to plan of care.                   Expected Discharge Plan: Skilled Nursing Facility Barriers to Discharge: Continued Medical Work up   Patient Goals and CMS Choice Patient states their goals for this hospitalization and ongoing recovery are:: Return home after rehab          Expected Discharge Plan and Services       Living arrangements for the past 2 months: Skilled Nursing Facility                                      Prior Living Arrangements/Services Living arrangements for the past 2 months: Skilled Nursing Facility Lives with:: Self Patient language and need for interpreter reviewed:: Yes Do you feel safe going back to the place where you live?: Yes      Need for Family Participation in Patient Care: Yes (Comment) Care giver support system in place?: Yes (comment)   Criminal Activity/Legal Involvement Pertinent to Current Situation/Hospitalization: No - Comment as needed  Activities of Daily Living      Permission Sought/Granted                  Emotional Assessment Appearance:: Appears stated age Attitude/Demeanor/Rapport: Engaged Affect (typically observed): Calm Orientation: : Oriented to Self, Oriented to Place, Oriented to  Time, Oriented to Situation Alcohol / Substance Use: Not Applicable Psych Involvement: No (comment)  Admission diagnosis:  fall, back pain Patient Active Problem List   Diagnosis Date Noted    Frequent falls 03/02/2023   History of lower GI bleeding 08/26/2021   Pain due to onychomycosis of toenails of both feet 01/27/2021   Cardiovascular risk factor > 12% 01/16/2019   Essential hypertension 08/03/2018   Pure hypercholesterolemia 08/03/2018   Mammogram declined 08/03/2018   PCP:  Dulce Sellar, NP Pharmacy:   [id number] [place], [place] - [address] AT [facility] [address] [place] [address] Phone: [phone] Fax: [phone]     Social Determinants of Health (SDOH) Social History: SDOH Screenings   Food Insecurity: No Food Insecurity (10/27/2022)  Housing: Low Risk  (10/27/2022)  Transportation Needs: No Transportation Needs (10/27/2022)  Utilities: Not At Risk (10/27/2022)  Depression (PHQ2-9): Low Risk  (10/27/2022)  Financial Resource Strain: Low Risk  (10/27/2022)  Physical Activity: Insufficiently Active (10/27/2022)  Social Connections: Socially Isolated (10/27/2022)  Stress: No Stress Concern Present (10/27/2022)  Tobacco Use: Medium Risk (03/02/2023)   SDOH Interventions:     Readmission Risk Interventions     No data to display

## 2023-03-02 NOTE — ED Provider Notes (Signed)
Emergency Medicine Observation Re-evaluation Note  Sonya Woods is a 72 y.o. female, seen on rounds today.  Pt initially presented to the ED for complaints of Fall (Low back pain) Currently, the patient is resting in her bed without acute distress, has not yet eaten her breakfast this morning.  Physical Exam  BP (!) 130/46   Pulse 66   Temp 98.2 F (36.8 C) (Oral)   Resp 16   Ht 5\' 7"  (1.702 m)   Wt 81.6 kg   SpO2 100%   BMI 28.19 kg/m  Physical Exam General: No acute distress Cardiac: No murmur on my auscultation Lungs: Breath sounds clear and symmetric on my auscultation Psych: No acute agitation  ED Course / MDM  EKG:   I have reviewed the labs performed to date as well as medications administered while in observation.  Recent changes in the last 24 hours include none reported.  Plan  Current plan is for awaiting assessment by PT/OT/social work to determine disposition or placement.Rush Landmark, Canary Brim, MD 03/02/23 [phone]

## 2023-03-02 NOTE — Progress Notes (Signed)
CSW spoke with patients sister Donalda Ewings [phone]  per the request of patient. Ms. Logan Bores stated she has been looking at [facility] and wasn't sure of any other facilities. CSW let Ms. Evans know she sent some referrals off today and she will let her know of any offers.

## 2023-03-02 NOTE — ED Notes (Signed)
Pt was reminded by RN that she needs to wear the brace when she gets up to walk or OOB.

## 2023-03-02 NOTE — Evaluation (Signed)
Occupational Therapy Evaluation Patient Details Name: Sonya Woods MRN: [id number] DOB: May 01, 1951 Today's Date: 03/02/2023   History of Present Illness 72 y.o. female presents to [facility] hospital after fall preceded by back pain. CT shows acute L1 superior endplate compression fx. PMH includes colitis, HTN, hyperfibrinogenemia, measles.   Clinical Impression   Patient admitted for the diagnosis above.  PTA se was living at home alone without assist.  Back pain is the deficit, currently she needs up to Min A for mobility and Max A for lower body ADL from a sit to stand level.  OT is indicated in the acute setting to address deficits, and Patient will benefit from continued inpatient follow up therapy, <3 hours/day.  Patient does not have the needed assist to transition directly home.         Recommendations for follow up therapy are one component of a multi-disciplinary discharge planning process, led by the attending physician.  Recommendations may be updated based on patient status, additional functional criteria and insurance authorization.   Assistance Recommended at Discharge Frequent or constant Supervision/Assistance  Patient can return home with the following Help with stairs or ramp for entrance;Assist for transportation;Assistance with cooking/housework;A lot of help with bathing/dressing/bathroom;A little help with walking and/or transfers    Functional Status Assessment     Equipment Recommendations  None recommended by OT    Recommendations for Other Services       Precautions / Restrictions Precautions Precautions: Fall;Back Required Braces or Orthoses: Spinal Brace Spinal Brace: Thoracolumbosacral orthotic;Applied in sitting position Restrictions Weight Bearing Restrictions: No      Mobility Bed Mobility   Bed Mobility: Sidelying to Sit, Sit to Sidelying   Sidelying to sit: Min assist     Sit to sidelying: Min assist      Transfers Overall transfer level:  Needs assistance Equipment used: Rolling walker (2 wheels) Transfers: Sit to/from Stand Sit to Stand: Min guard, From elevated surface                  Balance Overall balance assessment: Needs assistance Sitting-balance support: No upper extremity supported, Feet supported Sitting balance-Leahy Scale: Fair     Standing balance support: Reliant on assistive device for balance Standing balance-Leahy Scale: Poor                             ADL either performed or assessed with clinical judgement   ADL Overall ADL's : Needs assistance/impaired Eating/Feeding: Independent;Sitting   Grooming: Wash/dry hands;Wash/dry face;Set up;Sitting   Upper Body Bathing: Minimal assistance;Sitting   Lower Body Bathing: Moderate assistance;Sit to/from stand   Upper Body Dressing : Moderate assistance;Sitting   Lower Body Dressing: Maximal assistance;Sit to/from stand   Toilet Transfer: Minimal assistance;Stand-pivot;BSC/3in1;Rolling walker (2 wheels)                   Vision Patient Visual Report: No change from baseline       Perception     Praxis      Pertinent Vitals/Pain Pain Assessment Pain Assessment: Faces Faces Pain Scale: Hurts even more Pain Location: low back Pain Descriptors / Indicators: Sharp, Shooting Pain Intervention(s): Monitored during session     Hand Dominance Right   Extremity/Trunk Assessment Upper Extremity Assessment Upper Extremity Assessment: Overall WFL for tasks assessed   Lower Extremity Assessment Lower Extremity Assessment: Defer to PT evaluation   Cervical / Trunk Assessment Cervical / Trunk Assessment: Other exceptions Cervical / Trunk  Exceptions: Back injury   Communication Communication Communication: No difficulties   Cognition Arousal/Alertness: Awake/alert Behavior During Therapy: WFL for tasks assessed/performed Overall Cognitive Status: Impaired/Different from baseline Area of Impairment: Problem  solving, Memory                     Memory: Decreased short-term memory       Problem Solving: Slow processing       General Comments       Exercises     Shoulder Instructions      Home Living Family/patient expects to be discharged to:: Private residence Living Arrangements: Alone Available Help at Discharge: Family;Available PRN/intermittently Type of Home: House Home Access: Stairs to enter [employer] of Steps: 3 Entrance Stairs-Rails: Right Home Layout: One level     Bathroom Shower/Tub: Chief Strategy Officer: Standard Bathroom Accessibility: Yes How Accessible: Accessible via walker Home Equipment: Rolling Walker (2 wheels)          Prior Functioning/Environment Prior Level of Function : Independent/Modified Independent;Driving             Mobility Comments: ambulates with PRN use of RW ADLs Comments: completing her own ADL and iADL without assist        OT Problem List: Decreased strength;Decreased activity tolerance;Impaired balance (sitting and/or standing);Pain      OT Treatment/Interventions: Self-care/ADL training;Therapeutic activities;Patient/family education;DME and/or AE instruction;Balance training    OT Goals(Current goals can be found in the care plan section) Acute Rehab OT Goals Patient Stated Goal: Go home OT Goal Formulation: With patient Time For Goal Achievement: 03/16/23 Potential to Achieve Goals: Good ADL Goals Pt Will Perform Grooming: with supervision;standing Pt Will Perform Lower Body Dressing: with min assist;sit to/from stand;with adaptive equipment Pt Will Transfer to Toilet: with supervision;ambulating;regular height toilet  OT Frequency: Min 2X/week    Co-evaluation              AM-PAC OT "6 Clicks" Daily Activity     Outcome Measure Help from another person eating meals?: None Help from another person taking care of personal grooming?: None Help from another person  toileting, which includes using toliet, bedpan, or urinal?: A Little Help from another person bathing (including washing, rinsing, drying)?: A Lot Help from another person to put on and taking off regular upper body clothing?: A Lot Help from another person to put on and taking off regular lower body clothing?: A Lot 6 Click Score: 17   End of Session Equipment Utilized During Treatment: Gait belt;Rolling walker (2 wheels) Nurse Communication: Mobility status  Activity Tolerance: Patient limited by pain Patient left: in bed;with call bell/phone within reach  OT Visit Diagnosis: Unsteadiness on feet (R26.81);Muscle weakness (generalized) (M62.81);Pain                Time: 1540-1601 OT Time Calculation (min): 21 min Charges:  OT General Charges $OT Visit: 1 Visit OT Evaluation $OT Eval Moderate Complexity: 1 Mod  03/02/2023  RP, OTR/L  Acute Rehabilitation Services  Office:  [phone]   Suzanna Obey 03/02/2023, 3:07 PM

## 2023-03-02 NOTE — NC FL2 (Signed)
[place] MEDICAID FL2 LEVEL OF CARE FORM     IDENTIFICATION  Patient Name: Sonya Woods Birthdate: 06-21-1951 Sex: female Admission Date (Current Location): 03/01/2023  [facility] and [place] Number:  Producer, television/film/video and Address:  [facility], [address], [place], [place] [address]      Provider Number: [id number]  Attending Physician Name and Address:  Default, Provider, MD  Relative Name and Phone Number:  Donalda Ewings (Sister) [phone]    Current Level of Care: Hospital Recommended Level of Care: Skilled Nursing Facility Prior Approval Number:    Date Approved/Denied:   PASRR Number: [id number]  Discharge Plan: SNF    Current Diagnoses: Patient Active Problem List   Diagnosis Date Noted   Frequent falls 03/02/2023   History of lower GI bleeding 08/26/2021   Pain due to onychomycosis of toenails of both feet 01/27/2021   Cardiovascular risk factor > 12% 01/16/2019   Essential hypertension 08/03/2018   Pure hypercholesterolemia 08/03/2018   Mammogram declined 08/03/2018    Orientation RESPIRATION BLADDER Height & Weight     Self, Time, Situation, Place  Normal Continent Weight: 180 lb (81.6 kg) Height:  5\' 7"  (170.2 cm)  BEHAVIORAL SYMPTOMS/MOOD NEUROLOGICAL BOWEL NUTRITION STATUS      Continent Diet (Regular)  AMBULATORY STATUS COMMUNICATION OF NEEDS Skin   Limited Assist Verbally Normal                       Personal Care Assistance Level of Assistance  Bathing, Feeding, Dressing Bathing Assistance: Limited assistance Feeding assistance: Independent Dressing Assistance: Limited assistance     Functional Limitations Info  Sight, Hearing, Speech Sight Info: Adequate Hearing Info: Adequate Speech Info: Adequate    SPECIAL CARE FACTORS FREQUENCY                       Contractures Contractures Info: Not present    Additional Factors Info  Code Status, Allergies Code Status Info: Full Allergies  Info: Angiotensin Receptor Blockers, Losartan Potassium-hctz, Tetanus Toxoids, Tramadol           Current Medications (03/02/2023):  This is the current hospital active medication list Current Facility-Administered Medications  Medication Dose Route Frequency Provider Last Rate Last Admin   acetaminophen (TYLENOL) tablet 1,000 mg  1,000 mg Oral Q8H Loetta Rough, MD   1,000 mg at 03/02/23 0520   amLODipine (NORVASC) tablet 5 mg  5 mg Oral Daily Loetta Rough, MD   5 mg at 03/02/23 1002   lidocaine (LIDODERM) 5 % 1 patch  1 patch Transdermal Q24H Loetta Rough, MD   1 patch at 03/01/23 2344   oxyCODONE (Oxy IR/ROXICODONE) immediate release tablet 5 mg  5 mg Oral Q4H PRN Loetta Rough, MD   5 mg at 03/02/23 1002   rosuvastatin (CRESTOR) tablet 5 mg  5 mg Oral Daily Loetta Rough, MD   5 mg at 03/02/23 1002   Current Outpatient Medications  Medication Sig Dispense Refill   acetaminophen (TYLENOL) 500 MG tablet Take 1,000 mg by mouth every 6 (six) hours as needed for mild pain.     amLODipine (NORVASC) 5 MG tablet Take 1 tablet (5 mg total) by mouth 2 (two) times daily. 180 tablet 1   B Complex-C (B-COMPLEX WITH VITAMIN C) tablet Take 1 tablet by mouth daily.     cholecalciferol (VITAMIN D) 1000 units tablet Take 2,000 Units by mouth daily.  Coenzyme Q10 (CO Q 10 PO) Take 200 mg by mouth.     lisinopril-hydrochlorothiazide (ZESTORETIC) 10-12.5 MG tablet Take 1 tablet by mouth in the morning. 30 tablet 2   rosuvastatin (CRESTOR) 5 MG tablet Take 1 pill daily. 90 tablet 1     Discharge Medications: Please see discharge summary for a list of discharge medications.  Relevant Imaging Results:  Relevant Lab Results:   Additional Information [id number]  Susa Simmonds, LCSWA

## 2023-03-03 MED ORDER — OXYCODONE-ACETAMINOPHEN 5-325 MG PO TABS: 1.0000 | ORAL_TABLET | Freq: Three times a day (TID) | ORAL | 0 refills | Status: DC | PRN

## 2023-03-03 MED ORDER — METHOCARBAMOL 500 MG PO TABS: 500.0000 mg | ORAL_TABLET | Freq: Three times a day (TID) | ORAL | 0 refills | Status: DC | PRN

## 2023-03-03 MED ORDER — LIDOCAINE 4 % EX PTCH: 1.0000 | MEDICATED_PATCH | CUTANEOUS | 0 refills | Status: DC

## 2023-03-03 NOTE — ED Notes (Signed)
Patient awake and alert this morning, repositioned, and breakfast provided.

## 2023-03-03 NOTE — ED Provider Notes (Signed)
Emergency Medicine Observation Re-evaluation Note  Kenneth Daigler is a 72 y.o. female, seen on rounds today.  Pt initially presented to the ED for complaints of Fall (Low back pain) Currently, the patient is sitting on bed.  Physical Exam  BP (!) 119/57 (BP Location: Right Arm)   Pulse 66   Temp 98 F (36.7 C) (Oral)   Resp 17   Ht 5\' 7"  (1.702 m)   Wt 81.6 kg   SpO2 96%   BMI 28.19 kg/m  Physical Exam General: Awake, alert, nondistressed Cardiac: Normal heart rate Lungs: Unlabored respirations Psych: No agitation  ED Course / MDM  EKG:   I have reviewed the labs performed to date as well as medications administered while in observation.  Recent changes in the last 24 hours include acceptance to [facility].  Plan  Current plan is for transfer to [facility] today.    Gloris Manchester, MD 03/03/23 [phone]

## 2023-03-03 NOTE — Discharge Instructions (Signed)
Medications were sent to your pharmacy.   -Lidocaine patches can be placed daily on area of low back pain.  Avoid having more than 1 patch on your skin at any given time. -Robaxin is a muscle relaxer.  Take only as needed. -Percocet is a narcotic pain medication.  Take only as needed.  Avoid use of muscle relaxer and narcotic at the same time.

## 2023-03-09 ENCOUNTER — Other Ambulatory Visit: Payer: Self-pay

## 2023-03-09 ENCOUNTER — Telehealth: Payer: Self-pay | Admitting: Family

## 2023-03-09 DIAGNOSIS — S32010G Wedge compression fracture of first lumbar vertebra, subsequent encounter for fracture with delayed healing: Secondary | ICD-10-CM

## 2023-03-09 NOTE — Telephone Encounter (Signed)
Yes, ok to send referral, thanks.

## 2023-03-09 NOTE — Telephone Encounter (Signed)
Ortho referral ordered with dx Closed compression fracture of L1 lumbar vertebra  LMOVM of patient's sister number

## 2023-03-09 NOTE — Telephone Encounter (Signed)
Caller states pt is currently in rehab at [facility] facility. Per caller, pt would like to meet with an orthopedic to discuss further care since fall.   Patient did get on the phone and gave verbal permission for caller to speak on her behalf. Caller is her sister who can be reached at [phone].

## 2023-03-09 NOTE — Telephone Encounter (Signed)
error 

## 2023-03-18 ENCOUNTER — Telehealth (INDEPENDENT_AMBULATORY_CARE_PROVIDER_SITE_OTHER): Payer: Medicare Other | Admitting: Family

## 2023-03-18 DIAGNOSIS — W19XXXA Unspecified fall, initial encounter: Secondary | ICD-10-CM

## 2023-03-18 DIAGNOSIS — R413 Other amnesia: Secondary | ICD-10-CM | POA: Diagnosis not present

## 2023-03-18 NOTE — Progress Notes (Signed)
MyChart Video Visit    Virtual Visit via Video Note   This format is felt to be most appropriate for this patient at this time. Physical exam was limited by quality of the video and audio technology used for the visit. CMA was able to get the patient set up on a video visit.  Patient location: Home. Patient and provider in visit Provider location: Office  I discussed the limitations of evaluation and management by telemedicine and the availability of in person appointments. The patient expressed understanding and agreed to proceed.  Visit Date: 03/18/2023  Today's healthcare provider: Dulce Sellar, NP     Subjective:   Patient ID: Sonya Woods, female    DOB: [birth year], 72 y.o.   MRN: [id number]  Chief Complaint  Patient presents with   Memory Loss    since fall at home    HPI Memory concerns:  per sister, pt fell at home lumbar fracture,  currently in [facility] recovering, since her fall pt has been confused as to the events of her fall.  Sister thinks she fell actually 2 days before she notified anyone. She is concerned if she fell and hit her head and just does not remember. She has had off & on nausea & dizziness and memory issues. When asked, pt denies hitting her head, or does not remember hitting her head, denies any notable bleeding.  Sister states she has not noticed any other TIA or CVA sx like speech problems, facial or peripheral neuropathy or facial drooping. Sister & pt unsure if she has been receiving any narcotic pain meds. She has a f/u appt with a neurosurgeon for spinal imaging and sister is asking if a head CT scan could be ordered at the same time.   Assessment & Plan:  Memory deficits - sister concerned as seems to be a sudden change since her fall. Ordering head CT scan, sending referral to Neurology.   -     Ambulatory referral to Neurology -     CT HEAD WO CONTRAST ( ); Future  Fall, initial encounter - imaging found lumbar fx, pt does  not remember hitting head, but having cognitive changes since the fall.  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 No side effects. Will continue and recheck in 4 months at physical.     Past Surgical History:  Procedure Laterality Date   APPENDECTOMY     BIOPSY  07/26/2021   Procedure: BIOPSY;  Surgeon: Benancio Deeds, MD;  Location: [facility];  Service: Gastroenterology;;   CATARACT EXTRACTION Right    CESAREAN SECTION  1976   FLEXIBLE SIGMOIDOSCOPY N/A 07/26/2021   Procedure: FLEXIBLE SIGMOIDOSCOPY;  Surgeon: Benancio Deeds,  MD;  Location: [facility];  Service: Gastroenterology;  Laterality: N/A;   MOHS SURGERY  06/2017   nose    S/P Bone Tumor Surgery       Outpatient Medications Prior to Visit  Medication Sig Dispense Refill   acetaminophen (TYLENOL) 500 MG tablet Take 1,000 mg by mouth every 6 (six) hours as needed for mild pain.     amLODipine (NORVASC) 5 MG tablet Take 1 tablet (5 mg total) by mouth 2 (two) times daily. 180 tablet 1   B Complex-C (B-COMPLEX WITH VITAMIN C) tablet Take 1 tablet by mouth daily.     cholecalciferol (VITAMIN D) 1000 units tablet Take 2,000 Units by mouth daily.     Coenzyme Q10 (CO Q 10 PO) Take 200 mg by mouth.     lidocaine (LIDOCAINE PAIN RELIEF) 4 % Place 1 patch onto the skin  daily. Remove old patch prior to placing new one. 14 patch 0   lisinopril-hydrochlorothiazide (ZESTORETIC) 10-12.5 MG tablet Take 1 tablet by mouth in the morning. 30 tablet 2   methocarbamol (ROBAXIN) 500 MG tablet Take 1 tablet (500 mg total) by mouth every 8 (eight) hours as needed for muscle spasms. 20 tablet 0   oxyCODONE-acetaminophen (PERCOCET/ROXICET) 5-325 MG tablet Take 1 tablet by mouth every 8 (eight) hours as needed for severe pain. 15 tablet 0   rosuvastatin (CRESTOR) 5 MG tablet Take 1 pill daily. 90 tablet 1   No facility-administered medications prior to visit.    Allergies  Allergen Reactions   Angiotensin Receptor Blockers    Losartan Potassium-Hctz    Tetanus Toxoids    Tramadol Other (See Comments)       Objective:   Physical Exam Vitals and nursing note reviewed.  Constitutional:      General: Pt is not in acute distress.    Appearance: Normal appearance.  HENT:     Head: Normocephalic.  Pulmonary:     Effort: No respiratory distress.  Musculoskeletal:     Cervical back: Normal range of motion.  Skin:    General: Skin is dry.     Coloration: Skin is not pale.  Neurological:     Mental Status: Pt is alert and oriented to person, place, and time.  Psychiatric:        Mood and Affect: Mood normal.   There were no vitals taken for this visit.  Wt Readings from Last 3 Encounters:  03/01/23 180 lb (81.6 kg)  02/10/23 189 lb 6.4 oz (85.9 kg)  10/27/22 192 lb (87.1 kg)      I discussed the assessment and treatment plan with the patient. The patient was provided an opportunity to ask questions and all were answered. The patient agreed with the plan and demonstrated an understanding of the instructions.   The patient was advised to call back or seek an in-person evaluation if the symptoms worsen or if the condition fails to improve as anticipated.  Dulce Sellar, NP [facility] [phone] (phone) [phone]  (fax)  [facility]

## 2023-03-19 ENCOUNTER — Encounter: Payer: Self-pay | Admitting: Physician Assistant

## 2023-04-03 ENCOUNTER — Other Ambulatory Visit: Payer: Self-pay | Admitting: Family

## 2023-04-03 DIAGNOSIS — I10 Essential (primary) hypertension: Secondary | ICD-10-CM

## 2023-04-06 ENCOUNTER — Telehealth: Payer: Self-pay

## 2023-04-06 ENCOUNTER — Telehealth: Payer: Self-pay | Admitting: Family

## 2023-04-06 NOTE — Telephone Encounter (Signed)
Home Health Verbal Orders  Agency:  [facility]  Caller:  Louanne Belton and title  Requesting PT:    Reason for Request:  Orders for 2 time a week for 4 weeks, 1 time a week for 4 weeks for PT  Frequency:    HH needs F2F w/in last 30 days     [phone] Confidential VM

## 2023-04-06 NOTE — Transitions of Care (Post Inpatient/ED Visit) (Unsigned)
   04/06/2023  Name: Sonya Woods MRN: [id number] DOB: [birth year]  Today's TOC FU Call Status: Today's TOC FU Call Status:: Unsuccessul Call (1st Attempt) Unsuccessful Call (1st Attempt) Date: 04/06/23  Attempted to reach the patient regarding the most recent Inpatient/ED visit.  Follow Up Plan: Additional outreach attempts will be made to reach the patient to complete the Transitions of Care (Post Inpatient/ED visit) call.   Signature   Woodfin Ganja LPN [facility] Nurse Health Advisor Direct Dial [phone]

## 2023-04-07 NOTE — Telephone Encounter (Signed)
Returned call to [place], Verbal order Approved.

## 2023-04-07 NOTE — Transitions of Care (Post Inpatient/ED Visit) (Signed)
   04/07/2023  Name: Shital Dewaard MRN: [id number] DOB: 08-Nov-1950  Today's TOC FU Call Status: Today's TOC FU Call Status:: Unsuccessful Call (2nd Attempt) Unsuccessful Call (1st Attempt) Date: 04/06/23 Unsuccessful Call (2nd Attempt) Date: 04/07/23  Attempted to reach the patient regarding the most recent Inpatient/ED visit.  Follow Up Plan: No further outreach attempts will be made at this time. We have been unable to contact the patient.  Pt has fu appt on 04-09-23 at 1120am  Signature   Woodfin Ganja LPN [facility] Nurse Health Advisor Direct Dial [phone]

## 2023-04-09 ENCOUNTER — Ambulatory Visit (INDEPENDENT_AMBULATORY_CARE_PROVIDER_SITE_OTHER): Payer: Medicare Other | Admitting: Family

## 2023-04-09 ENCOUNTER — Encounter: Payer: Self-pay | Admitting: Family

## 2023-04-09 VITALS — BP 96/55 | HR 62 | Temp 97.8°F | Ht 67.0 in | Wt 173.1 lb

## 2023-04-09 DIAGNOSIS — R413 Other amnesia: Secondary | ICD-10-CM

## 2023-04-09 DIAGNOSIS — S32010A Wedge compression fracture of first lumbar vertebra, initial encounter for closed fracture: Secondary | ICD-10-CM | POA: Insufficient documentation

## 2023-04-09 DIAGNOSIS — S32010G Wedge compression fracture of first lumbar vertebra, subsequent encounter for fracture with delayed healing: Secondary | ICD-10-CM

## 2023-04-09 DIAGNOSIS — R296 Repeated falls: Secondary | ICD-10-CM | POA: Diagnosis not present

## 2023-04-09 DIAGNOSIS — I10 Essential (primary) hypertension: Secondary | ICD-10-CM

## 2023-04-09 HISTORY — DX: Wedge compression fracture of first lumbar vertebra, initial encounter for closed fracture: S32.010A

## 2023-04-09 MED ORDER — AMLODIPINE BESYLATE 5 MG PO TABS
5.0000 mg | ORAL_TABLET | Freq: Every day | ORAL | 1 refills | Status: DC
Start: 2023-04-09 — End: 2023-04-15

## 2023-04-09 NOTE — Patient Instructions (Signed)
It was very nice to see you today!   Call the radiology office regarding the CT scan.  Stop giving the Amlodipine in the evening. Continue the morning dose and all other meds. Check a blood pressure at home about 1-2 hours after taking meds, goal for blood pressure is less than 140/90 or higher than 105/60.  Schedule a 2-3 month follow up visit.      PLEASE NOTE:  If you had any lab tests please let us know if you have not heard back within a few days. You may see your results on MyChart before we have a chance to review them but we will give you a call once they are reviewed by [place]. If we ordered any referrals today, please let us know if you have not heard from their office within the next week.

## 2023-04-09 NOTE — Assessment & Plan Note (Addendum)
pt fell at home, seen in ER on 5/27, sent to rehab, [facility], and d/c on 7/1 to live with sister sister had requested an ORTHO referral while at rehab,but when called she wanted to wait to schedule until after pt back home (per review of notes in EMR) they have been contacted by PT but have not started yet, but pt is making good progress since home, standing on her own, walking short distances w/walker reports no pain meds needed, Tylenol prn MRI was scheduled for 7/31 by neurosurgery? or a provider in SNF - no provider notes in EMR or imaging orders, but sister was notified of appt f/u prn

## 2023-04-09 NOTE — Progress Notes (Signed)
Patient ID: Sonya Woods, female    DOB: [birth year], 72 y.o.   MRN: [id number]  Chief Complaint  Patient presents with   Follow-up    Pt was seen in ED on 5/27 and was sent to [facility] living and rehab, Back fracture. Pt has been getting better, Has been back home since 04/05/2023.    HPI: Hospital/SNF follow up:  HX from last visit:  per sister, pt fell at home lumbar fracture,  currently in [place] recovering, since her fall pt has been confused as to the events of her fall.  Sister thinks she fell actually 2 days before she notified anyone. She is concerned if she fell and hit her head and just does not remember. She has had off & on nausea & dizziness and memory issues. When asked, pt denies hitting her head, or does not remember hitting her head, denies any notable bleeding.  Sister states she has not noticed any other TIA or CVA sx like speech problems, facial or peripheral neuropathy or facial drooping. Sister & pt unsure if she has been receiving any narcotic pain meds. She has a f/u appt with a neurosurgeon for spinal imaging and sister is asking if a head CT scan could be ordered at the same time. *Today pt here with sister; pt d/c from [facility] on 7/1,  wearing back brace, but reports pain is minimal, takes Tylenol prn, walking with walker, PT to start next week, has MRI of lumbar scheduled for 7/31, guessing ordered by [facility] provider, sister unsure.  Hypertension: Patient is currently maintained on the following medications for blood pressure: Amlodipine, Lisinopril-HCTZ Failed meds include: none Patient reports good compliance with blood pressure medications. Patient denies chest pain, headaches, shortness of breath or swelling. Last 3 blood pressure readings in our office are as follows: BP Readings from Last 3 Encounters:  04/09/23 (!) 96/55  03/03/23 (!) 154/66  02/10/23 128/75    Assessment & Plan:  Closed compression fracture of L1 lumbar vertebra with delayed healing,  subsequent encounter Assessment & Plan: pt fell at home, seen in ER on 5/27, sent to rehab, [place], and d/c on 7/1 to live with sister sister had requested an ORTHO referral while at rehab,but when called she wanted to wait to schedule until after pt back home (per review of notes in EMR) they have been contacted by PT but have not started yet, but pt is making good progress since home, standing on her own, walking short distances w/walker reports no pain meds needed, Tylenol prn MRI was scheduled for 7/31 by neurosurgery? or a provider in SNF - no provider notes in EMR or imaging orders, but sister was notified of appt f/u prn   Essential hypertension Assessment & Plan: chronic taking Amlodipine 5mg  bid & Lisinopril -HCTZ 10-12.5mg  qam BP low today, given Midodrine in SNF advised to switch Amlodipine to qam only with Lisinopril/hydrochlorothiazide monitor BP at home, let me know of abnormal readings, parameters given f/u 2-3 months  Orders: -     amLODIPine Besylate; Take 1 tablet (5 mg total) by mouth daily.  Dispense: 90 tablet; Refill: 1  Memory deficits Assessment & Plan: new since falling, pt does not remember hitting her head last visit pt with sister who requested CT scan of head radiology has tried to reach sister to schedule, phone # provided today pt also started on Donepezil 5mg  at SNF, pt no longer on OXY or Robaxin sister has noticed mild improvement per EMR pt has an appt  with [facility] on 7/8, advised she keep this will continue to monitor   Frequent falls - none since 5/27 when she suffered lumbar fx - continue to advise pt & sister on safety precautions at home for prevention, having PT services in the home starting next week.   Subjective:    Outpatient Medications Prior to Visit  Medication Sig Dispense Refill   acetaminophen (TYLENOL) 500 MG tablet Take 1,000 mg by mouth every 6 (six) hours as needed for mild pain.     B Complex-C (B-COMPLEX WITH VITAMIN  C) tablet Take 1 tablet by mouth daily.     cholecalciferol (VITAMIN D) 1000 units tablet Take 2,000 Units by mouth daily.     Coenzyme Q10 (CO Q 10 PO) Take 200 mg by mouth.     donepezil (ARICEPT) 5 MG tablet Take 5 mg by mouth daily.     lidocaine (LIDOCAINE PAIN RELIEF) 4 % Place 1 patch onto the skin daily. Remove old patch prior to placing new one. 14 patch 0   lisinopril-hydrochlorothiazide (ZESTORETIC) 10-12.5 MG tablet Take 1 tablet by mouth in the morning. 30 tablet 2   mirtazapine (REMERON) 7.5 MG tablet Take 7.5 mg by mouth at bedtime.     ondansetron (ZOFRAN) 4 MG tablet Take 4 mg by mouth every 8 (eight) hours as needed for nausea.     rosuvastatin (CRESTOR) 5 MG tablet Take 1 pill daily. 90 tablet 1   amLODipine (NORVASC) 5 MG tablet TAKE 1 TABLET(5 MG) BY MOUTH TWICE DAILY 180 tablet 1   guaiFENesin (MUCINEX) 600 MG 12 hr tablet Take by mouth 2 (two) times daily.     methocarbamol (ROBAXIN) 500 MG tablet Take 1 tablet (500 mg total) by mouth every 8 (eight) hours as needed for muscle spasms. 20 tablet 0   midodrine (PROAMATINE) 5 MG tablet Take 5 mg by mouth 2 (two) times daily.     oxyCODONE-acetaminophen (PERCOCET/ROXICET) 5-325 MG tablet Take 1 tablet by mouth every 8 (eight) hours as needed for severe pain. 15 tablet 0   No facility-administered medications prior to visit.   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 No side effects. Will continue and recheck in 4 months at physical.    Past Surgical History:  Procedure Laterality Date   APPENDECTOMY     BIOPSY  07/26/2021   Procedure: BIOPSY;  Surgeon: Benancio Deeds, MD;  Location: [facility];  Service: Gastroenterology;;   CATARACT EXTRACTION Right    CESAREAN SECTION  1976   FLEXIBLE SIGMOIDOSCOPY N/A 07/26/2021   Procedure: FLEXIBLE SIGMOIDOSCOPY;  Surgeon: Benancio Deeds, MD;  Location: [facility];  Service: Gastroenterology;  Laterality: N/A;   MOHS SURGERY  06/2017   nose    S/P Bone Tumor Surgery      Allergies  Allergen Reactions   Angiotensin Receptor Blockers    Losartan Potassium-Hctz    Tetanus Toxoids    Tramadol Other (See Comments)      Objective:    Physical Exam Vitals and nursing note reviewed.  Constitutional:      Appearance: Normal appearance.  Cardiovascular:     Rate and Rhythm: Normal rate and regular rhythm.  Pulmonary:     Effort: Pulmonary effort is normal.     Breath sounds: Normal breath sounds.  Musculoskeletal:        General: Normal range of motion.  Skin:    General: Skin is warm and dry.  Neurological:     Mental Status: She is alert.     Motor: Weakness present.     Gait: Gait abnormal (using walker).  Psychiatric:        Mood and Affect: Mood normal.        Behavior: Behavior normal.    BP (!) 96/55   Pulse 62   Temp 97.8 F (36.6 C) (Temporal)   Ht 5\' 7"  (1.702 m)   Wt 173 lb 2 oz (78.5 kg)   SpO2 99%   BMI 27.12 kg/m  Wt Readings from Last 3 Encounters:  04/09/23 173 lb 2 oz (78.5 kg)  03/01/23 180 lb (81.6 kg)  02/10/23 189 lb 6.4 oz (85.9 kg)      *Extra time ( ) spent with patient today which consisted of chart review, discussing diagnoses, work up, treatment, answering questions, and documentation.   Dulce Sellar, NP

## 2023-04-09 NOTE — Assessment & Plan Note (Addendum)
new since falling, pt does not remember hitting her head last visit pt with sister who requested CT scan of head radiology has tried to reach sister to schedule, phone # provided today pt also started on Donepezil 5mg  at SNF, pt no longer on OXY or Robaxin sister has noticed mild improvement per EMR pt has an appt with [facility] on 7/8, advised she keep this will continue to monitor

## 2023-04-09 NOTE — Assessment & Plan Note (Signed)
chronic taking Amlodipine 5mg  bid & Lisinopril -HCTZ 10-12.5mg  qam BP low today, given Midodrine in SNF advised to switch Amlodipine to qam only with Lisinopril/hydrochlorothiazide monitor BP at home, let me know of abnormal readings, parameters given f/u 2-3 months

## 2023-04-12 ENCOUNTER — Ambulatory Visit
Admission: RE | Admit: 2023-04-12 | Discharge: 2023-04-12 | Disposition: A | Discharge status: Home / Self Care | Payer: Medicare Other | Source: Ambulatory Visit | Attending: Family | Admitting: Family

## 2023-04-12 ENCOUNTER — Ambulatory Visit: Payer: Medicare Other | Admitting: Physician Assistant

## 2023-04-12 ENCOUNTER — Ambulatory Visit: Payer: Medicare Other

## 2023-04-12 DIAGNOSIS — R413 Other amnesia: Secondary | ICD-10-CM

## 2023-04-12 DIAGNOSIS — W19XXXA Unspecified fall, initial encounter: Secondary | ICD-10-CM

## 2023-04-15 ENCOUNTER — Encounter: Payer: Self-pay | Admitting: Family

## 2023-04-15 ENCOUNTER — Ambulatory Visit (INDEPENDENT_AMBULATORY_CARE_PROVIDER_SITE_OTHER): Payer: Medicare Other | Admitting: Family

## 2023-04-15 VITALS — BP 90/50 | HR 62 | Temp 98.0°F | Ht 67.0 in | Wt 173.0 lb

## 2023-04-15 DIAGNOSIS — R413 Other amnesia: Secondary | ICD-10-CM

## 2023-04-15 DIAGNOSIS — I10 Essential (primary) hypertension: Secondary | ICD-10-CM | POA: Diagnosis not present

## 2023-04-15 DIAGNOSIS — I959 Hypotension, unspecified: Secondary | ICD-10-CM | POA: Diagnosis not present

## 2023-04-15 DIAGNOSIS — E782 Mixed hyperlipidemia: Secondary | ICD-10-CM | POA: Diagnosis not present

## 2023-04-15 DIAGNOSIS — S32010D Wedge compression fracture of first lumbar vertebra, subsequent encounter for fracture with routine healing: Secondary | ICD-10-CM | POA: Diagnosis not present

## 2023-04-15 DIAGNOSIS — R630 Anorexia: Secondary | ICD-10-CM

## 2023-04-15 HISTORY — DX: Anorexia: R63.0

## 2023-04-15 MED ORDER — MIRTAZAPINE 7.5 MG PO TABS
7.5000 mg | ORAL_TABLET | Freq: Every day | ORAL | 0 refills | Status: DC
Start: 2023-04-15 — End: 2023-07-13

## 2023-04-15 MED ORDER — DONEPEZIL HCL 5 MG PO TABS
5.0000 mg | ORAL_TABLET | Freq: Every day | ORAL | 0 refills | Status: DC
Start: 2023-04-15 — End: 2023-07-13

## 2023-04-15 MED ORDER — ROSUVASTATIN CALCIUM 5 MG PO TABS
ORAL_TABLET | ORAL | 1 refills | Status: DC
Start: 2023-04-15 — End: 2023-10-18

## 2023-04-15 NOTE — Assessment & Plan Note (Signed)
started on Mirtazapine 7.5mg  in SNF sister reports pt had no appetite, but has been improving weight holding steady will refill med today, reassess in 90d f/u 3 mos

## 2023-04-15 NOTE — Patient Instructions (Signed)
It was very nice to see you today!   STOP taking the Amlodipine and the Lisinopril-hydrochlorothiazide  (HCTZ) that are for your blood pressure. Check your blood pressure daily and let me know of any readings above 135/90 or if it is still remaining below 95 (top number). Eat a salty meal today: soup, crackers, takeout food, drink a V8 or tomato juice, etc. to help bring up your blood pressure. If the top number remains below 85 and she has the dizziness you need to call 911.  Schedule a 3 month follow up if you haven't already.       PLEASE NOTE:  If you had any lab tests please let us know if you have not heard back within a few days. You may see your results on MyChart before we have a chance to review them but we will give you a call once they are reviewed by [place]. If we ordered any referrals today, please let us know if you have not heard from their office within the next week.

## 2023-04-15 NOTE — Assessment & Plan Note (Signed)
chronic taking Crestor 5mg  qd last lipids wnl sending refill f/u 3 mos for fasting labs

## 2023-04-15 NOTE — Assessment & Plan Note (Signed)
Aricept 5mg  daily pt has not had f/u with Neurology doing much better cognitively, unsure if d/t out of SNF, not taking pain meds, or d/t Aricept sister reports noticing improvement will refill for 3 mos and reassess at that time

## 2023-04-15 NOTE — Progress Notes (Signed)
Patient ID: Sonya Woods, female    DOB: June 14, 1951, 72 y.o.   MRN: [id number]  Chief Complaint  Patient presents with   back brace    Pt c/o brace being uncomfortable, PT told them to see PCP so it can be adjusted.    Hypertension   Hyperlipidemia   Memory Loss    HPI: Memory concerns:  HX:  per sister, after her fall pt had been confused as to the events of her fall.  Sister thinks she fell actually 2 days before she notified anyone. She was concerned if she fell and hit her head and just didn't not remember. She had off & on nausea & dizziness and memory issues. Sister stated she had not noticed any other TIA or CVA sx like speech problems, facial or peripheral neuropathy or facial drooping. She was started on Aricept 5mg  every day in SNF. CT scan of head just recently was wnl. Today, pt & sister note she has had continued improvement in cognition. Hypertension: Patient is currently maintained on the following medications for blood pressure: Amlodipine 5mg  daily, Lisinopril-hydrochlorothiazide 10-12.5mg  daily. Failed meds include: none Patient reports good compliance with blood pressure medications. Patient denies chest pain, headaches, shortness of breath or swelling. Last 3 blood pressure readings in our office are as follows: BP Readings from Last 3 Encounters:  04/15/23 (!) 90/50  04/09/23 (!) 96/55  03/03/23 (!) 154/66  Hyperlipidemia: Patient is currently maintained on the following medication for hyperlipidemia: Crestor 5mg . Patient denies myalgia or other side effects. Patient reports good compliance with low fat/low cholesterol diet.  Last lipid panel as follows: Lab Results  Component Value Date   CHOL 122 08/04/2022   HDL 39.70 08/04/2022   LDLCALC 59 08/04/2022   TRIG 116.0 08/04/2022   CHOLHDL 3 08/04/2022   Back fracture:  pt reports she has been working with PT at home for continued rehab to strengthen her back. She states the therapist told her they needed my  permission to adjust her back brace so that it is more comfortable, as it currently rises up to her throat whenever she it sitting. She was advised to wear the brace until they have the results back from her MRI on 7/31.  Assessment & Plan:  Closed compression fracture of L1 lumbar vertebra with routine healing, subsequent encounter Assessment & Plan: working with PT at home therapist needs my permission to adjust her back brace so that it is more comfortable currently rises up to her throat whenever she it sitting advised by ortho at SNF to wear the brace until they have the results back from her MRI on 7/31. letter provided today to give to PT (coming back tomorrow am) f/u prn   Hypotension, unspecified hypotension type- pt and sister report she took both Lisinopril-hydrochlorothiazide and Amlodipine today. Had reduced Amlodipine to daily from bid, but BP still low today, pt is asymptomatic today, pt reports she has been very dizzy at times upon standing, but they have not checked BP at home. Advised to stop both meds. Check daily BP and let me know abnormal readings, parameters provided.  Essential hypertension Assessment & Plan: chronic taking Amlodipine 5mg  bid & Lisinopril-HCTZ 10-12.5mg  qam BP very low again today, pt took both pills this am, pt is asymptomatic advised to stop both meds starting tomorrow monitor BP daily for the next week and let me know any readings >135/90 or below 95/60. f/u 3 months   Mixed hyperlipidemia Assessment & Plan: chronic taking Crestor  5mg  qd last lipids wnl sending refill f/u 3 mos for fasting labs   Orders: -     Rosuvastatin Calcium; Take 1 pill daily. For cholesterol.  Dispense: 90 tablet; Refill: 1  Memory deficits Assessment & Plan: Aricept 5mg  daily pt has not had f/u with Neurology doing much better cognitively, unsure if d/t out of SNF, not taking pain meds, or d/t Aricept sister reports noticing improvement will refill for 3  mos and reassess at that time  Orders: -     Donepezil HCl; Take 1 tablet (5 mg total) by mouth daily. For memory.  Dispense: 90 tablet; Refill: 0  Decreased appetite Assessment & Plan: started on Mirtazapine 7.5mg  in SNF sister reports pt had no appetite, but has been improving weight holding steady will refill med today, reassess in 90d f/u 3 mos  Orders: -     Mirtazapine; Take 1 tablet (7.5 mg total) by mouth at bedtime. For sleep and appetite.  Dispense: 90 tablet; Refill: 0   Subjective:    Outpatient Medications Prior to Visit  Medication Sig Dispense Refill   acetaminophen (TYLENOL) 500 MG tablet Take 1,000 mg by mouth every 6 (six) hours as needed for mild pain.     B Complex-C (B-COMPLEX WITH VITAMIN C) tablet Take 1 tablet by mouth daily.     cholecalciferol (VITAMIN D) 1000 units tablet Take 2,000 Units by mouth daily.     Coenzyme Q10 (CO Q 10 PO) Take 200 mg by mouth.     lidocaine (LIDOCAINE PAIN RELIEF) 4 % Place 1 patch onto the skin daily. Remove old patch prior to placing new one. 14 patch 0   amLODipine (NORVASC) 5 MG tablet Take 1 tablet (5 mg total) by mouth daily. 90 tablet 1   donepezil (ARICEPT) 5 MG tablet Take 5 mg by mouth daily.     lisinopril-hydrochlorothiazide (ZESTORETIC) 10-12.5 MG tablet Take 1 tablet by mouth in the morning. 30 tablet 2   mirtazapine (REMERON) 7.5 MG tablet Take 7.5 mg by mouth at bedtime.     ondansetron (ZOFRAN) 4 MG tablet Take 4 mg by mouth every 8 (eight) hours as needed for nausea.     rosuvastatin (CRESTOR) 5 MG tablet Take 1 pill daily. 90 tablet 1   No facility-administered medications prior to visit.   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 No side effects. Will continue and recheck in 4 months at physical.    Past Surgical History:  Procedure Laterality Date   APPENDECTOMY     BIOPSY  07/26/2021   Procedure: BIOPSY;  Surgeon: Benancio Deeds, MD;  Location: [facility];  Service: Gastroenterology;;   CATARACT EXTRACTION Right  CESAREAN SECTION  1976   FLEXIBLE SIGMOIDOSCOPY N/A 07/26/2021   Procedure: FLEXIBLE SIGMOIDOSCOPY;  Surgeon: Benancio Deeds, MD;  Location: [facility];  Service: Gastroenterology;  Laterality: N/A;   MOHS SURGERY  06/2017   nose    S/P Bone Tumor Surgery      Allergies  Allergen Reactions   Angiotensin Receptor Blockers    Losartan Potassium-Hctz    Tetanus Toxoids    Tramadol Other (See Comments)      Objective:    Physical Exam Vitals and nursing note reviewed.  Constitutional:      Appearance: Normal appearance.  Cardiovascular:     Rate and Rhythm: Normal rate and regular rhythm.  Pulmonary:     Effort: Pulmonary effort is normal.     Breath sounds: Normal breath sounds.  Musculoskeletal:        General: Normal range of motion.  Skin:    General: Skin is warm and dry.  Neurological:     Mental Status: She is alert.     Motor: Weakness (mild) present.     Gait: Gait abnormal (using walker).  Psychiatric:        Mood and Affect: Mood normal.        Behavior: Behavior normal.    BP (!) 90/50   Pulse 62   Temp 98 F (36.7 C) (Temporal)   Ht 5\' 7"  (1.702 m)   Wt 173 lb (78.5 kg)   SpO2 98%   BMI 27.10 kg/m  Wt Readings from Last 3 Encounters:  04/15/23 173 lb  (78.5 kg)  04/09/23 173 lb 2 oz (78.5 kg)  03/01/23 180 lb (81.6 kg)       Dulce Sellar, NP

## 2023-04-15 NOTE — Assessment & Plan Note (Addendum)
chronic taking Amlodipine 5mg  bid & Lisinopril-HCTZ 10-12.5mg  qam BP very low again today, pt took both pills this am, pt is asymptomatic advised to stop both meds starting tomorrow monitor BP daily for the next week and let me know any readings >135/90 or below 95/60. f/u 3 months

## 2023-04-15 NOTE — Assessment & Plan Note (Signed)
working with PT at home therapist needs my permission to adjust her back brace so that it is more comfortable currently rises up to her throat whenever she it sitting advised by ortho at SNF to wear the brace until they have the results back from her MRI on 7/31. letter provided today to give to PT (coming back tomorrow am) f/u prn

## 2023-04-19 ENCOUNTER — Ambulatory Visit: Payer: Medicare Other | Admitting: Physician Assistant

## 2023-04-19 ENCOUNTER — Ambulatory Visit: Payer: Medicare Other

## 2023-04-26 ENCOUNTER — Telehealth: Payer: Self-pay | Admitting: Family

## 2023-04-26 NOTE — Telephone Encounter (Signed)
Pts daughter would like a call back. She would like to talk about pts cognitive and other issues. Please advise.

## 2023-04-27 NOTE — Telephone Encounter (Signed)
Pt's daughter called and would like to know how to go about getting home care for pt due to memory loss. They are very concerned and believe memory loss isn't improving and would like to discuss with PCP.  I offered a virtual appointment but daughter stated she stays in [place] from her [place]. Pt has not seen Neuro yet, referral states pt refused twice. Neuro number given to daughter to schedule an appointment.   Please advise.

## 2023-04-30 NOTE — Telephone Encounter (Signed)
Daughter gave a verbalized understanding.

## 2023-04-30 NOTE — Telephone Encounter (Signed)
Her insurance will not cover help in the home. She has to call local agencies that supply aides to come in for so many hours per day, but you pay per hour. Examples would be [facility], [facility], etc. Pt needs to see neurology for more guidance. Thx

## 2023-05-10 ENCOUNTER — Ambulatory Visit: Payer: Medicare Other | Admitting: Physician Assistant

## 2023-05-11 ENCOUNTER — Encounter: Payer: Self-pay | Admitting: Physician Assistant

## 2023-05-11 ENCOUNTER — Other Ambulatory Visit: Payer: Medicare Other

## 2023-05-11 ENCOUNTER — Ambulatory Visit (INDEPENDENT_AMBULATORY_CARE_PROVIDER_SITE_OTHER): Payer: Medicare Other | Admitting: Physician Assistant

## 2023-05-11 VITALS — BP 163/76 | HR 82 | Resp 18 | Ht 67.0 in | Wt 168.0 lb

## 2023-05-11 DIAGNOSIS — R413 Other amnesia: Secondary | ICD-10-CM

## 2023-05-11 LAB — TSH: TSH: 0.4 u[IU]/mL (ref 0.35–5.50)

## 2023-05-11 LAB — VITAMIN B12: Vitamin B-12: 367 pg/mL (ref 211–911)

## 2023-05-11 NOTE — Patient Instructions (Addendum)
It was a pleasure to see you today at our office.   Recommendations:  Neurocognitive evaluation at our office   Continue donepezil 5 mg daily. Side effects were discussed  Check labs MRI of the brain, the radiology office will call you to arrange you appointment   Follow up in 6  months   For psychiatric meds, mood meds: Please have your primary care physician manage these medications.  If you have any severe symptoms of a stroke, or other severe issues such as confusion,severe chills or fever, etc call 911 or go to the ER as you may need to be evaluated further    For assessment of decision of mental capacity and competency:  Call Dr. Erick Blinks, geriatric psychiatrist at [phone]  Counseling regarding caregiver distress, including caregiver depression, anxiety and issues regarding community resources, adult day care programs, adult living facilities, or memory care questions:  please contact your  Primary Doctor's Social Worker   Whom to call: Memory  decline, memory medications: Call our office [phone]    [email or web address]   RECOMMENDATIONS FOR ALL PATIENTS WITH MEMORY PROBLEMS: 1. Continue to exercise (Recommend 30 minutes of walking everyday, or 3 hours every week) 2. Increase social interactions - continue going to [place] and enjoy social gatherings with friends and family 3. Eat healthy, avoid fried foods and eat more fruits and vegetables 4. Maintain adequate blood pressure, blood sugar, and blood cholesterol level. Reducing the risk of stroke and cardiovascular disease also helps promoting better memory. 5. Avoid stressful situations. Live a simple life and avoid aggravations. Organize your time and prepare for the next day in anticipation. 6. Sleep well, avoid any interruptions of sleep and avoid any distractions in the bedroom that may interfere with adequate sleep quality 7. Avoid sugar, avoid sweets as  there is a strong link between excessive sugar intake, diabetes, and cognitive impairment We discussed the Mediterranean diet, which has been shown to help patients reduce the risk of progressive memory disorders and reduces cardiovascular risk. This includes eating fish, eat fruits and green leafy vegetables, nuts like almonds and hazelnuts, walnuts, and also use olive oil. Avoid fast foods and fried foods as much as possible. Avoid sweets and sugar as sugar use has been linked to worsening of memory function.  There is always a concern of gradual progression of memory problems. If this is the case, then we may need to adjust level of care according to patient needs. Support, both to the patient and caregiver, should then be put into place.      You have been referred for a neuropsychological evaluation (i.e., evaluation of memory and thinking abilities). Please bring someone with you to this appointment if possible, as it is helpful for the doctor to hear from both you and another adult who knows you well. Please bring eyeglasses and hearing aids if you wear them.    The evaluation will take approximately 3 hours and has two parts:   The first part is a clinical interview with the neuropsychologist (Dr. Milbert Coulter or Dr. Roseanne Reno). During the interview, the neuropsychologist will speak with you and the individual you brought to the appointment.    The second part of the evaluation is testing with the doctor's technician Annabelle Harman or Selena Batten). During the testing, the technician will ask you to remember different types of material, solve problems, and answer some questionnaires. Your family member will not be present for this portion of the evaluation.   Please note: We must reserve  several hours of the neuropsychologist's time and the psychometrician's time for your evaluation appointment. As such, there is a No-Show fee of $100. If you are unable to attend any of your appointments, please contact our office as  soon as possible to reschedule.      DRIVING: Regarding driving, in patients with progressive memory problems, driving will be impaired. We advise to have someone else do the driving if trouble finding directions or if minor accidents are reported. Independent driving assessment is available to determine safety of driving.   If you are interested in the driving assessment, you can contact the following:  [employer] in [place] [phone]  [facility] [phone]  [facility] [phone]  [facility] [phone] or [phone]   FALL PRECAUTIONS: Be cautious when walking. Scan the area for obstacles that may increase the risk of trips and falls. When getting up in the mornings, sit up at the edge of the bed for a few minutes before getting out of bed. Consider elevating the bed at the head end to avoid drop of blood pressure when getting up. Walk always in a well-lit room (use night lights in the walls). Avoid area rugs or power cords from appliances in the middle of the walkways. Use a walker or a cane if necessary and consider physical therapy for balance exercise. Get your eyesight checked regularly.  FINANCIAL OVERSIGHT: Supervision, especially oversight when making financial decisions or transactions is also recommended.  HOME SAFETY: Consider the safety of the kitchen when operating appliances like stoves, microwave oven, and blender. Consider having supervision and share cooking responsibilities until no longer able to participate in those. Accidents with firearms and other hazards in the house should be identified and addressed as well.   ABILITY TO BE LEFT ALONE: If patient is unable to contact 911 operator, consider using LifeLine, or when the need is there, arrange for someone to stay with patients. Smoking is a fire hazard, consider supervision or cessation. Risk of wandering should be assessed by caregiver and if detected  at any point, supervision and safe proof recommendations should be instituted.  MEDICATION SUPERVISION: Inability to self-administer medication needs to be constantly addressed. Implement a mechanism to ensure safe administration of the medications.      Mediterranean Diet A Mediterranean diet refers to food and lifestyle choices that are based on the traditions of countries located on the [employer]. This way of eating has been shown to help prevent certain conditions and improve outcomes for people who have chronic diseases, like kidney disease and heart disease. What are tips for following this plan? Lifestyle  Cook and eat meals together with your family, when possible. Drink enough fluid to keep your urine clear or pale yellow. Be physically active every day. This includes: Aerobic exercise like running or swimming. Leisure activities like gardening, walking, or housework. Get 7-8 hours of sleep each night. If recommended by your health care provider, drink red wine in moderation. This means 1 glass a day for nonpregnant women and 2 glasses a day for men. A glass of wine equals 5 oz (150 mL). Reading food labels  Check the serving size of packaged foods. For foods such as rice and pasta, the serving size refers to the amount of cooked product, not dry. Check the total fat in packaged foods. Avoid foods that have saturated fat or trans fats. Check the ingredients list for added sugars, such as corn syrup. Shopping  At [employer],  buy most of your food from the areas near the walls of the store. This includes: Fresh fruits and vegetables (produce). Grains, beans, nuts, and seeds. Some of these may be available in unpackaged forms or large amounts (in bulk). Fresh seafood. Poultry and eggs. Low-fat dairy products. Buy whole ingredients instead of prepackaged foods. Buy fresh fruits and vegetables in-season from local farmers markets. Buy frozen fruits and vegetables in  resealable bags. If you do not have access to quality fresh seafood, buy precooked frozen shrimp or canned fish, such as tuna, salmon, or sardines. Buy small amounts of raw or cooked vegetables, salads, or olives from the deli or salad bar at your store. Stock your pantry so you always have certain foods on hand, such as olive oil, canned tuna, canned tomatoes, rice, pasta, and beans. Cooking  Cook foods with extra-virgin olive oil instead of using butter or other vegetable oils. Have meat as a side dish, and have vegetables or grains as your main dish. This means having meat in small portions or adding small amounts of meat to foods like pasta or stew. Use beans or vegetables instead of meat in common dishes like chili or lasagna. Experiment with different cooking methods. Try roasting or broiling vegetables instead of steaming or sauteing them. Add frozen vegetables to soups, stews, pasta, or rice. Add nuts or seeds for added healthy fat at each meal. You can add these to yogurt, salads, or vegetable dishes. Marinate fish or vegetables using olive oil, lemon juice, garlic, and fresh herbs. Meal planning  Plan to eat 1 vegetarian meal one day each week. Try to work up to 2 vegetarian meals, if possible. Eat seafood 2 or more times a week. Have healthy snacks readily available, such as: Vegetable sticks with hummus. Greek yogurt. Fruit and nut trail mix. Eat balanced meals throughout the week. This includes: Fruit: 2-3 servings a day Vegetables: 4-5 servings a day Low-fat dairy: 2 servings a day Fish, poultry, or lean meat: 1 serving a day Beans and legumes: 2 or more servings a week Nuts and seeds: 1-2 servings a day Whole grains: 6-8 servings a day Extra-virgin olive oil: 3-4 servings a day Limit red meat and sweets to only a few servings a month What are my food choices? Mediterranean diet Recommended Grains: Whole-grain pasta. Brown rice. Bulgar wheat. Polenta. Couscous.  Whole-wheat bread. Orpah Cobb. Vegetables: Artichokes. Beets. Broccoli. Cabbage. Carrots. Eggplant. Green beans. Chard. Kale. Spinach. Onions. Leeks. Peas. Squash. Tomatoes. Peppers. Radishes. Fruits: Apples. Apricots. Avocado. Berries. Bananas. Cherries. Dates. Figs. Grapes. Lemons. Melon. Oranges. Peaches. Plums. Pomegranate. Meats and other protein foods: Beans. Almonds. Sunflower seeds. Pine nuts. Peanuts. Cod. Salmon. Scallops. Shrimp. Tuna. Tilapia. Clams. Oysters. Eggs. Dairy: Low-fat milk. Cheese. Greek yogurt. Beverages: Water. Red wine. Herbal tea. Fats and oils: Extra virgin olive oil. Avocado oil. Grape seed oil. Sweets and desserts: [place] yogurt with honey. Baked apples. Poached pears. Trail mix. Seasoning and other foods: Basil. Cilantro. Coriander. Cumin. Mint. Parsley. Sage. Rosemary. Tarragon. Garlic. Oregano. Thyme. Pepper. Balsalmic vinegar. Tahini. Hummus. Tomato sauce. Olives. Mushrooms. Limit these Grains: Prepackaged pasta or rice dishes. Prepackaged cereal with added sugar. Vegetables: Deep fried potatoes (french fries). Fruits: Fruit canned in syrup. Meats and other protein foods: Beef. Pork. Lamb. Poultry with skin. Hot dogs. Tomasa Blase. Dairy: Ice cream. Sour cream. Whole milk. Beverages: Juice. Sugar-sweetened soft drinks. Beer. Liquor and spirits. Fats and oils: Butter. Canola oil. Vegetable oil. Beef fat (tallow). Lard. Sweets and desserts: Cookies. Cakes. Pies. Candy. Seasoning  and other foods: Mayonnaise. Premade sauces and marinades. The items listed may not be a complete list. Talk with your dietitian about what dietary choices are right for you. Summary The Mediterranean diet includes both food and lifestyle choices. Eat a variety of fresh fruits and vegetables, beans, nuts, seeds, and whole grains. Limit the amount of red meat and sweets that you eat. Talk with your health care provider about whether it is safe for you to drink red wine in moderation. This  means 1 glass a day for nonpregnant women and 2 glasses a day for men. A glass of wine equals 5 oz (150 mL). This information is not intended to replace advice given to you by your health care provider. Make sure you discuss any questions you have with your health care provider. Document Released: 05/14/2016 Document Revised: 06/16/2016 Document Reviewed: 05/14/2016 Elsevier Interactive Patient Education  2017 [employer].   Labs today [address] [facility] [phone]

## 2023-05-11 NOTE — Progress Notes (Signed)
Assessment/Plan:    The patient is seen in neurologic consultation at the request of Dulce Sellar, NP for the evaluation of memory.  Sonya Woods is a very pleasant 72 y.o. year old RH female with a history of hypertension, hyperlipidemia, chronic lower back pain, vitamin D deficiency, prediabetes, arthritis seen today for evaluation of memory loss. MoCA today is 20/30. Workup is in progress.  Patient is already on donepezil 5 mg daily, started at SNF, tolerating well.  There is concern for neurodegenerative and vascular disease.      Memory Impairment of unclear etiology  MRI brain without contrast to assess for underlying structural abnormality and assess vascular load  Neurocognitive testing to further evaluate cognitive concerns and determine other underlying cause of memory changes, including potential contribution from sleep, anxiety, or depression  Check B12, TSH Continue donepezil 5 mg daily, side effects discussed.  May consider increasing the [facility] pending on the MRI results.  PT OT Recommend good control of cardiovascular risk factors.   Continue to control mood as per PCP Fol[place]ow up in 6 months  Increase socialization  Subjective:    The patient is accompanied by her caregiver who supplements the history.    How long did patient have memory difficulties?  "My sister told me that I have memory issues,  but I live alone, I don't socialize often so I don't have many people I talk to".  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 Caregiver does not notice "anything alarming". Recently, after her fall  after closed compression fracture of the lumbar vertebrae she reports that her family told her that her memory is worse.  At SNF they started her on Aricept 5 mg daily,  tolerating well . repeats oneself?  Denies.  Disoriented when walking into a room?  Patient denies    Leaving objects in unusual places?  denies   Wandering behavior? denies   Any  personality changes ? denies   Any history of depression?: denies   Hallucinations or paranoia? denies   Seizures? denies    Any sleep changes?  Sleeps well. Denies vivid dreams, REM behavior or sleepwalking   Sleep apnea? denies   Any hygiene concerns?  denies   Independent of bathing and dressing? Endorsed  Does the patient need help with medications? Patient is in charge , she has a pill tray.  Who is in charge of the finances? Patient is in charge. "I have an accounting book".      Any changes in appetite?  At SNF, they started her on mirtazapine 7.5 mg to improve her appetite with good response.   Patient have trouble swallowing?  denies   Does the patient cook? No  Any headaches?  denies   Chronic pain? denies   Back pain well controlled at this time. No pain meds  Ambulates with difficulty? Needs a walker to ambulate  when she is not at home, for stability.   Recent falls or head injuries?" While in the commode,I heard a pop and excruciating pain in the lower back, and was going to get up, so I laid on the floor, crawled and call 911". No surgery is required. The patient does not remember if she hit her head during the fall.   Vision changes? Unilateral weakness, numbness or tingling? denies   Any tremors? denies   Any anosmia? denies   Any incontinence of urine? denies   Any bowel dysfunction? denies  Patient lives alone, has a caregiver to help her with ADLs History of heavy alcohol intake? denies   History of heavy tobacco use? denies  Quit 7 y ago.  Family history of dementia?  She does not know. She thinks that her mother may have had memory issues later in life.  Does patient drive? No . She is selling her car, "I feel it is time".    Most recent CT of the head 04/12/2023  was without acute findings, mild chronic microvascular changes, normal volume for age.     Allergies  Allergen Reactions   Angiotensin Receptor Blockers    Losartan Potassium-Hctz    Tetanus  Toxoids    Tramadol Other (See Comments)    Current Outpatient Medications  Medication Instructions   acetaminophen (TYLENOL) 1,000 mg, Oral, Every 6 hours PRN   amLODipine (NORVASC) 5 MG tablet    B Complex-C (B-COMPLEX WITH VITAMIN C) tablet 1 tablet, Oral, Daily   cholecalciferol (VITAMIN D) 2,000 Units, Oral, Daily   Coenzyme Q10 (CO Q 10 PO) 200 mg, Oral   donepezil (ARICEPT) 5 mg, Oral, Daily, For memory.   hydrochlorothiazide (HYDRODIURIL) 12.5 MG tablet    lidocaine (LIDOCAINE PAIN RELIEF) 4 % 1 patch, Transdermal, Every 24 hours, Remove old patch prior to placing new one.   mirtazapine (REMERON) 7.5 mg, Oral, Daily at bedtime, For sleep and appetite.   rosuvastatin (CRESTOR) 5 MG tablet Take 1 pill daily. For cholesterol.     VITALS:   Vitals:   05/11/23 0736  BP: (!) 163/76  Pulse: 82  Resp: 18  SpO2: 96%  Weight: 168 lb (76.2 kg)  Height: 5\' 7"  (1.702 m)      PHYSICAL EXAM   HEENT:  Normocephalic, atraumatic.  The superficial temporal arteries are without ropiness or tenderness. Cardiovascular: Regular rate and rhythm. Lungs: Clear to auscultation bilaterally. Neck: There are no carotid bruits noted bilaterally.  NEUROLOGICAL:    05/11/2023    7:00 AM  Montreal Cognitive Assessment   Visuospatial/ Executive (0/5) 2  Naming (0/3) 3  Attention: Read list of digits (0/2) 2  Attention: Read list of letters (0/1) 1  Attention: Serial 7 subtraction starting at 100 (0/3) 1  Language: Repeat phrase (0/2) 2  Language : Fluency (0/1) 0  Abstraction (0/2) 2  Delayed Recall (0/5) 1  Orientation (0/6) 5  Total 19  Adjusted Score (based on education) 20        No data to display           Orientation:  Alert and oriented to person, and time, not to place.  No aphasia or dysarthria. Fund of knowledge is appropriate. Recent and remote memory impaired.  Attention and concentration are reduced.  Able to name objects and repeat phrases. Delayed recall   1/5 Cranial nerves: There is good facial symmetry. Extraocular muscles are intact and visual fields are full to confrontational testing. Speech is fluent and clear. No tongue deviation. Hearing is intact to conversational tone.  Tone: Tone is good throughout. Sensation: Sensation is intact to light touch.  Vibration is intact at the bilateral big toe.  Coordination: The patient has no difficulty with RAM's or FNF bilaterally. Normal finger to nose  Motor: Strength is 5/5 in the bilateral upper and lower extremities. There is no pronator drift. There are no fasciculations noted. DTR's: Deep tendon reflexes are 2/4 bilaterally. Gait and Station: The patient is able to ambulate without difficulty when not using the walker. Gait is  cautious and narrow. Stride length is normal        Thank you for allowing [place] the opportunity to participate in the care of this nice patient. Please do not hesitate to contact us for any questions or concerns.   Total time spent on today's visit was 63 minutes dedicated to this patient today, preparing to see patient, examining the patient, ordering tests and/or medications and counseling the patient, documenting clinical information in the EHR or other health record, independently interpreting results and communicating results to the patient/family, discussing treatment and goals, answering patient's questions and coordinating care.  Cc:  Dulce Sellar, NP  Marlowe Kays 05/11/2023 9:05 AM

## 2023-06-18 ENCOUNTER — Telehealth: Payer: Self-pay | Admitting: Family

## 2023-06-18 ENCOUNTER — Telehealth: Payer: Self-pay

## 2023-06-18 NOTE — Telephone Encounter (Signed)
Patient dropped off document Home Health Certificate (Order ID [id number]), to be filled out by provider. Patient requested to send it back via Fax within 5-days. Document is located in providers tray at front office.Please advise

## 2023-06-18 NOTE — Telephone Encounter (Signed)
MRI of the brain wo contrast dob [birth year] she did not have done, mutliple attempts have been made. FYI

## 2023-06-18 NOTE — Telephone Encounter (Signed)
Received

## 2023-07-12 ENCOUNTER — Other Ambulatory Visit: Payer: Self-pay | Admitting: Family

## 2023-07-12 DIAGNOSIS — R630 Anorexia: Secondary | ICD-10-CM

## 2023-07-12 DIAGNOSIS — R413 Other amnesia: Secondary | ICD-10-CM

## 2023-08-05 ENCOUNTER — Encounter: Payer: Self-pay | Admitting: Psychology

## 2023-08-06 ENCOUNTER — Encounter: Payer: Self-pay | Admitting: Psychology

## 2023-08-06 ENCOUNTER — Ambulatory Visit: Payer: Medicare Other | Admitting: Psychology

## 2023-08-06 ENCOUNTER — Ambulatory Visit: Payer: Medicare Other

## 2023-08-06 DIAGNOSIS — G3184 Mild cognitive impairment, so stated: Secondary | ICD-10-CM | POA: Insufficient documentation

## 2023-08-06 DIAGNOSIS — R4189 Other symptoms and signs involving cognitive functions and awareness: Secondary | ICD-10-CM

## 2023-08-06 NOTE — Progress Notes (Signed)
   Psychometrician Note   Cognitive testing was administered to [employer] by Shan Levans, B.S. (psychometrist) under the supervision of Dr. Newman Nickels, Ph.D., licensed psychologist on 08/06/2023. Ms. Moffa did not appear overtly distressed by the testing session per behavioral observation or responses across self-report questionnaires. Rest breaks were offered.    The battery of tests administered was selected by Dr. Newman Nickels, Ph.D. with consideration to Ms. Nez's current level of functioning, the nature of her symptoms, emotional and behavioral responses during interview, level of literacy, observed level of motivation/effort, and the nature of the referral question. This battery was communicated to the psychometrist. Communication between Dr. Newman Nickels, Ph.D. and the psychometrist was ongoing throughout the evaluation and Dr. Newman Nickels, Ph.D. was immediately accessible at all times. Dr. Newman Nickels, Ph.D. provided supervision to the psychometrist on the date of this service to the extent necessary to assure the quality of all services provided.    Lameshia Hypolite will return within approximately 1-2 weeks for an interactive feedback session with Dr. Milbert Coulter at which time her test performances, clinical impressions, and treatment recommendations will be reviewed in detail. Ms. Scharf understands she can contact our office should she require our assistance before this time.  A total of 125 minutes of billable time were spent face-to-face with Ms. Bankson by the psychometrist. This includes both test administration and scoring time. Billing for these services is reflected in the clinical report generated by Dr. Newman Nickels, Ph.D.  This note reflects time spent with the psychometrician and does not include test scores or any clinical interpretations made by Dr. Milbert Coulter. The full report will follow in a separate note.

## 2023-08-06 NOTE — Progress Notes (Addendum)
NEUROPSYCHOLOGICAL EVALUATION [place]. [facility] Department of Neurology  Date of Evaluation: August 06, 2023  Reason for Referral:   Sonya Woods is a 72 y.o. right-handed Caucasian female referred by Marlowe Kays, PA-C, to characterize her current cognitive functioning and assist with diagnostic clarity and treatment planning in the context of subjective cognitive decline.   Assessment and Plan:   Clinical Impression(s): Sonya Woods pattern of performance is suggestive of severe impairment surrounding both delayed retrieval and recognition/consolidation aspects of memory. A further impairment was exhibited across verbal fluency (semantic worse than phonemic), while variability was exhibited across encoding (i.e., learning) aspects of memory. Performances were appropriate relative to age-matched peers across processing speed, attention/concentration, executive functioning (outside of a semantic fluency set shifting task), safety/judgment, receptive language, confrontation naming, and visuospatial abilities. Sonya Woods did not report significant difficulties completing instrumental activities of daily living (ADLs) independently prior to her May 2024 lumbar compression fracture. She does benefit from a caretaker providing assistance with ADLs currently. 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  Regarding the underlying cause for memory impairment, current testing patterns do raise concern for underlying Alzheimer's disease. Despite Sonya Woods demonstrating an ability to learn structured, contextualized information well initially, she was fully amnestic (i.e., 0% retention) across all three administered memory tasks and performed very poorly across yes/no recognition trials. This suggests evidence for rapid forgetting and a pronounced  storage impairment, both of which are the hallmark testing patterns of this illness. Further impairment surrounding semantic fluency would follow typical disease progression. Intact confrontation naming is encouraging and could suggest this disease process being in relatively early stages. Continued medical monitoring will be important moving forward.  She does not display behavioral characteristics or testing patterns worrisome for other neurodegenerative illnesses such as Lewy body disease, another more rare parkinsonian condition, or frontotemporal lobar degeneration. A prior CT scan did not suggest prominent vascular disease, making a vascular cause for ongoing dysfunction quite unlikely. A brain MRI had been ordered but not scheduled or completed, making additional anatomical theorizations unable to be performed.   Recommendations: A repeat neuropsychological evaluation in 12-18 months could be considered to assess the trajectory of future cognitive decline should it occur. This evaluation would focus on potential decline across non-memory areas. As she was fully amnestic across all memory testing (i.e., 0% retention), assessing further memory decline over time will not be possible. A repeat evaluation could also aid in future efforts towards improved diagnostic clarity.  I would recommend that she follow through with Ms. Wertman's recommendation surrounding the completion of a brain MRI to better rule in or out possible anatomical correlates for ongoing cognitive impairment.   [name] has already been prescribed a medication aimed to address memory loss and concerns surrounding Alzheimer's disease (i.e., donepezil/Aricept). She is encouraged to continue taking this medication as prescribed. It is important to highlight that this medication has been shown to slow functional decline in some individuals. There is no current treatment which can stop or reverse cognitive decline when caused by a  neurodegenerative illness.   Should there be progression of current deficits over time, Sonya Woods is unlikely to regain any independent living skills lost. Therefore, it is recommended that she remain as involved as possible in all aspects of household chores, finances, and medication management, with supervision to ensure adequate performance. She will likely benefit from the establishment and  maintenance of a routine in order to maximize her functional abilities over time.  It will be important for Sonya Woods to have another person with her when in situations where she may need to process information, weigh the pros and cons of different options, and make decisions, in order to ensure that she fully understands and recalls all information to be considered.  If not already done, Sonya Woods and her family may want to discuss her wishes regarding durable power of attorney and medical decision making, so that she can have input into these choices. If they require legal assistance with this, long-term care resource access, or other aspects of estate planning, they could reach out to [employer] at [phone] for a free consultation. Additionally, they may wish to discuss future plans for caretaking and seek out community options for in home/residential care should they become necessary.  Sonya Woods is encouraged to attend to lifestyle factors for brain health (e.g., regular physical exercise, good nutrition habits and consideration of the MIND-DASH diet, regular participation in cognitively-stimulating activities, and general stress management techniques), which are likely to have benefits for both emotional adjustment and cognition. Optimal control of vascular risk factors (including safe cardiovascular exercise and adherence to dietary recommendations) is encouraged. Continued participation in activities which provide mental stimulation and social interaction is also recommended.   Important  information should be provided to Sonya Woods in written format in all instances. This information should be placed in a highly frequented and easily visible location within her home to promote recall. External strategies such as written notes in a consistently used memory journal, visual and nonverbal auditory cues such as a calendar on the refrigerator or appointments with alarm, such as on a cell phone, can also help maximize recall.  To address problems with fluctuating attention and/or executive dysfunction, she may wish to consider:   -Avoiding external distractions when needing to concentrate   -Limiting exposure to fast paced environments with multiple sensory demands   -Writing down complicated information and using checklists   -Attempting and completing one task at a time (i.e., no multi-tasking)   -Verbalizing aloud each step of a task to maintain focus   -Taking frequent breaks during the completion of steps/tasks to avoid fatigue   -Reducing the amount of information considered at one time   -Scheduling more difficult activities for a time of day where she is usually most alert  Review of Records:   Ms. Maloy was seen by [facility] Marlowe Kays, PA-C) on 05/11/2023 for an evaluation of memory loss. At that time, Ms. Moynahan remarked "My sister told me that I have memory issues, but I live alone, I don't socialize often so I don't have many people I talk to." Broadly speaking, Ms. Conliffe denied prominent memory dysfunction. She experienced a closed compression fracture of a lumbar vertebrae this past May 2024 and her family noted some progressive memory decline when she was [place] at a local SNF. She was started on a low dose of donepezil while at the SNF. With Ms. Wertman, performance on a brief cognitive screening instrument (MOCA) was 20/30. Ultimately, Ms. Vandersteen was referred for a comprehensive neuropsychological evaluation to characterize her cognitive abilities and to  assist with diagnostic clarity and treatment planning.   Neuroimaging Head CT on 04/12/2023 in the context of memory loss concerns was unremarkable. A brain MRI had been ordered by Ms. Wertman. However, this scan does not appear to have been scheduled or completed at the  time of this writing.   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   Hypokalemia 07/24/2021   Low vitamin D level    Measles    Mixed hyperlipidemia 08/03/2018   Pain due to onychomycosis of toenails of both feet 01/27/2021   Prediabetes 08/03/2018    Past Surgical History:  Procedure Laterality Date   APPENDECTOMY     BIOPSY  07/26/2021   Procedure: BIOPSY;  Surgeon: Benancio Deeds, MD;  Location: [facility];  Service: Gastroenterology;;   CATARACT EXTRACTION Right    CESAREAN SECTION  1976   FLEXIBLE SIGMOIDOSCOPY N/A 07/26/2021   Procedure: FLEXIBLE SIGMOIDOSCOPY;  Surgeon: Benancio Deeds, MD;  Location: [facility];  Service: Gastroenterology;  Laterality: N/A;   MOHS SURGERY  06/2017   nose    S/P Bone Tumor Surgery       Current Outpatient Medications:    acetaminophen (TYLENOL) 500 MG tablet, Take 1,000 mg by mouth every 6 (six) hours as needed for mild pain., Disp: , Rfl:    amLODipine (NORVASC) 5 MG tablet, , Disp: , Rfl:    B Complex-C (B-COMPLEX WITH VITAMIN C) tablet, Take 1 tablet by mouth daily., Disp: , Rfl:    cholecalciferol (VITAMIN D) 1000 units tablet,  Take 2,000 Units by mouth daily., Disp: , Rfl:    Coenzyme Q10 (CO Q 10 PO), Take 200 mg by mouth., Disp: , Rfl:    donepezil (ARICEPT) 5 MG tablet, TAKE 1 TABLET(5 MG) BY MOUTH DAILY FOR MEMORY, Disp: 90 tablet, Rfl: 0   hydrochlorothiazide (HYDRODIURIL) 12.5 MG tablet, , Disp: , Rfl:    lidocaine (LIDOCAINE PAIN RELIEF) 4 %, Place 1 patch onto the skin daily. Remove old patch prior to placing new one., Disp: 14 patch, Rfl: 0   mirtazapine (REMERON) 7.5 MG tablet, TAKE 1 TABLET(7.5 MG) BY MOUTH AT BEDTIME FOR SLEEP AND APPETITE, Disp: 90 tablet, Rfl: 0   rosuvastatin (CRESTOR) 5 MG tablet, Take 1 pill daily. For cholesterol., Disp: 90 tablet, Rfl: 1  Clinical Interview:   The following information was obtained during a clinical interview with Ms. Brasseur and her caretaker prior to cognitive testing.  Cognitive Symptoms: Decreased short-term memory: Endorsed "a little bit." She largely described generalized forgetfulness from time to time but did appear to diminish overall concerns. She did not report trouble recalling names or details of recent conversations. Her caretaker who has been with her for the past two months also reported some "little things" with memory here and there but generally denied significant concerns. Her caretaker did state that Ms. Cabreja's family has expressed concern surrounding increased repetition in day-to-day conversation.  Decreased long-term memory: Denied. Decreased attention/concentration: Denied. Reduced processing speed: Denied. Difficulties with executive functions: Denied. They also denied any prominent personality changes.  Difficulties with emotion regulation: Denied. Difficulties with receptive language: Denied. Difficulties with word finding: Denied. Decreased visuoperceptual ability: Denied.  Difficulties completing ADLs: Denied. Since her lumbar compression fracture, she has hired a caretaker to provide assistance with ADLs. Prior to this, no  concerns were noted. She recently sold her car due to concerns of what might/could happen if her cognition has begun to decline. Prior to this, she did not report instances of getting lost or any recent motor vehicle accidents.   Additional Medical History: History of traumatic brain injury/concussion: Denied. History of stroke: Denied. History of seizure activity: Denied. History of known exposure to toxins: Denied. Symptoms of chronic pain: Denied. Experience of frequent headaches/migraines: Denied. Frequent instances of dizziness/vertigo: Denied.  Sensory changes: She utilizes reading glasses with benefit. Other sensory changes/difficulties (e.g., hearing, taste, smell) were denied.  Balance/coordination difficulties: She described her balance as "pretty good" overall and denied any recent falls. One side of the body was not said to be weaker relative to the other. She does commonly ambulate with the assistance of a walker. Much of this appeared precautionary in nature as she emphasized not wanting to fall and experience another significant orthopedic injury.  Other motor difficulties: Denied.  Sleep History: Estimated hours obtained each night: 7-8 hours.  Difficulties falling asleep: Denied. Difficulties staying asleep: Denied. Feels rested and refreshed upon awakening: Endorsed.  History of snoring: Denied. History of waking up gasping for air: Denied. Witnessed breath cessation while asleep: Denied.  History of vivid dreaming: Denied. Excessive movement while asleep: Denied. Instances of acting out her dreams: Denied.  Psychiatric/Behavioral Health History: Depression: She described her current mood as "good, very positive." She denied to her knowledge any prior mental health concerns or formal diagnoses. Current or remote suicidal ideation, intent, or plan was denied.  Anxiety: Denied. Mania: Denied. Trauma History: Denied. Visual/auditory hallucinations: Denied. Delusional  thoughts: Denied.  Tobacco: Denied. Alcohol: She denied current alcohol consumption as well as a history of problematic alcohol abuse or dependence.  Recreational drugs: Denied.  Family History: Problem Relation Age of Onset   Dementia Mother        possible Alzheimer's disease   Mental illness Mother    Hypertension Father    Hyperlipidemia Father    Heart attack Father    This information was confirmed by Ms. Urton.  Academic/Vocational History: Highest level of educational attainment: 12 years. She graduated from high school and described herself as a good (A/B) student in academic settings. Math was noted as a likely relative weakness.  History of developmental delay: Denied. History of grade repetition: Denied. Enrollment in special education courses: Denied. History of LD/ADHD: Denied.  Employment: Retired. She previously worked in Warehouse manager capacities.   Evaluation Results:   Behavioral Observations: Ms. Vandeman was accompanied by her caretaker, arrived to her appointment on time, and was appropriately dressed and groomed. She appeared alert and oriented. She ambulated slowly but no frank instability was observed. Gross motor functioning appeared intact upon informal observation and no abnormal movements (e.g., tremors) were noted. Her affect was generally relaxed and positive. She did express some apprehension surrounding upcoming memory testing. Spontaneous speech was fluent and word finding difficulties were not observed during the clinical interview. Thought processes were coherent, organized, and normal in content. Insight into her cognitive difficulties appeared limited and I have concern that she does not fully appreciate the extent and severity of ongoing memory impairment.   During testing, sustained attention was appropriate. Task engagement was adequate and she persisted when challenged. Overall, Ms. Massie was cooperative with the clinical  interview and subsequent testing  procedures.   Adequacy of Effort: The validity of neuropsychological testing is limited by the extent to which the individual being tested may be assumed to have exerted adequate effort during testing. Ms. Keefauver expressed her intention to perform to the best of her abilities and exhibited adequate task engagement and persistence. Scores across stand-alone and embedded performance validity measures were within expectation. As such, the results of the current evaluation are believed to be a valid representation of Ms. Delre current cognitive functioning.  Test Results: Ms. States was mildly disoriented at the time of the current evaluation. She incorrectly stated her age ("24"). She was one day off when stating the current day of the week and was unable to name the clinic.   Intellectual abilities based upon educational and vocational attainment were estimated to be in the average range. Premorbid abilities were estimated to be within the average range based upon a single-word reading test.   Processing speed was below average to average. Basic attention was average. More complex attention (e.g., working memory) was also average. Executive functioning was below average to average outside of an isolated impairment across a semantic set shifting task (D-KEFS Verbal Fluency). She performed in the average range across a task assessing safety and judgment.   Assessed receptive language abilities were above average. Likewise, Ms. Plett did not exhibit any difficulties comprehending task instructions and answered all questions asked of her appropriately. Assessed expressive language was somewhat variable. Phonemic fluency was well below average to below average, semantic fluency was exceptionally low to below average, and confrontation naming was average to above average.   Assessed visuospatial/visuoconstructional abilities were average.    Learning (i.e., encoding)  of novel information was exceptionally low across a list learning task but average across a story-based task. Spontaneous delayed recall (i.e., retrieval) of previously learned information was exceptionally low. Retention rates were 0% across a story learning task, 0% across a list learning task, and 0% across a figure drawing task. Performance across recognition tasks was exceptionally low to well below average, suggesting negligible evidence for information consolidation.   Results of emotional screening instruments suggested that recent symptoms of generalized anxiety were in the minimal range, while symptoms of depression were within normal limits. A screening instrument assessing recent sleep quality suggested the presence of minimal sleep dysfunction.  Tables of Scores:   Note: This summary of test scores accompanies the interpretive report and should not be considered in isolation without reference to the appropriate sections in the text. Descriptors are based on appropriate normative data and may be adjusted based on clinical judgment. Terms such as "Within Normal Limits" and "Outside Normal Limits" are used when a more specific description of the test score cannot be determined.       Percentile - Normative Descriptor > 98 - Exceptionally High 91-97 - Well Above Average 75-90 - Above Average 25-74 - Average 9-24 - Below Average 2-8 - Well Below Average < 2 - Exceptionally Low       Validity:   DESCRIPTOR       DCT: --- --- Within Normal Limits  RBANS EI: --- --- Within Normal Limits  WAIS-IV RDS: --- --- Within Normal Limits       Orientation:      Raw Score Percentile   NAB Orientation, Form 1 25/29 --- ---       Cognitive Screening:      Raw Score Percentile   SLUMS: 17/30 --- ---       RBANS,  Form A: Standard Score/ Scaled Score Percentile   Total Score 74 4 Well Below Average  Immediate Memory 78 7 Well Below Average    List Learning 3 1 Exceptionally Low    Story  Memory 9 37 Average  Visuospatial/Constructional 96 39 Average    Figure Copy 9 37 Average    Line Orientation 17/20 51-75 Average  Language 82 12 Below Average    Picture Naming 10/10 51-75 Average    Semantic Fluency 3 1 Exceptionally Low  Attention 91 27 Average    Digit Span 10 50 Average    Coding 7 16 Below Average  Delayed Memory 48 <1 Exceptionally Low    List Recall 0/10 <2 Exceptionally Low    List Recognition 14/20 <2 Exceptionally Low    Story Recall 1 <1 Exceptionally Low    Story Recognition 7/12 5-7 Well Below Average    Figure Recall 1 <1 Exceptionally Low    Figure Recognition 3/8 4-8 Well Below Average        Intellectual Functioning:      Standard Score Percentile   Test of Premorbid Functioning: 98 45 Average       Attention/Executive Function:     Trail Making Test (TMT): Raw Score (T Score) Percentile     Part A 38 secs.,  0 errors (50) 50 Average    Part B 106 secs.,  0 errors (45) 31 Average         Scaled Score Percentile   WAIS-IV Digit Span: 8 25 Average    Forward 8 25 Average    Backward 9 37 Average    Sequencing 9 37 Average        Scaled Score Percentile   WAIS-IV Similarities: 7 16 Below Average       D-KEFS Color-Word Interference Test: Raw Score (Scaled Score) Percentile     Color Naming 37 secs. (9) 37 Average    Word Reading 27 secs. (9) 37 Average    Inhibition 96 secs. (6) 9 Below Average      Total Errors 4 errors (9) 37 Average    Inhibition/Switching 97 secs. (8) 25 Average      Total Errors 1 error (12) 75 Above Average       D-KEFS Verbal Fluency Test: Raw Score (Scaled Score) Percentile     Letter Total Correct 22 (6) 9 Below Average    Category Total Correct 26 (7) 16 Below Average    Category Switching Total Correct 7 (4) 2 Well Below Average    Category Switching Accuracy 3 (2) <1 Exceptionally Low      Total Set Loss Errors 5 (6) 9 Below Average      Total Repetition Errors 2 (11) 63 Average       NAB Executive  Functions Module, Form 1: T Score Percentile     Judgment 54 66 Average       Language:     Verbal Fluency Test: Raw Score (T Score) Percentile     Phonemic Fluency (FAS) 22 (36) 8 Well Below Average    Animal Fluency 11 (32) 4 Well Below Average        NAB Language Module, Form 1: T Score Percentile     Auditory Comprehension 58 79 Above Average    Naming 30/31 (58) 79 Above Average       Visuospatial/Visuoconstruction:      Raw Score Percentile   Clock Drawing: 9/10 --- Within Normal Limits  Scaled Score Percentile   WAIS-IV Block Design: 8 25 Average       Mood and Personality:      Raw Score Percentile   Geriatric Depression Scale: 3 --- Within Normal Limits  Geriatric Anxiety Scale: 0 --- Minimal    Somatic 0 --- Minimal    Cognitive 0 --- Minimal    Affective 0 --- Minimal       Additional Questionnaires:      Raw Score Percentile   PROMIS Sleep Disturbance Questionnaire: 8 --- None to Slight   Informed Consent and Coding/Compliance:   The current evaluation represents a clinical evaluation for the purposes previously outlined by the referral source and is in no way reflective of a forensic evaluation.   Ms. Virgin was provided with a verbal description of the nature and purpose of the present neuropsychological evaluation. Also reviewed were the foreseeable risks and/or discomforts and benefits of the procedure, limits of confidentiality, and mandatory reporting requirements of this provider. The patient was given the opportunity to ask questions and receive answers about the evaluation. Oral consent to participate was provided by the patient.   This evaluation was conducted by Newman Nickels, Ph.D., ABPP-CN, board certified clinical neuropsychologist. Ms. Engleman completed a clinical interview with Dr. Milbert Coulter, billed as one unit [phone], and 125 minutes of cognitive testing and scoring, billed as one unit [phone] and three additional units [phone]. Psychometrist Shan Levans, B.S. assisted Dr. Milbert Coulter with test administration and scoring procedures. As a separate and discrete service, one unit [id number] and two units [phone] were billed for Dr. Tammy Sours time spent in interpretation and report writing.

## 2023-08-10 ENCOUNTER — Encounter: Payer: Self-pay | Admitting: Family

## 2023-08-10 ENCOUNTER — Ambulatory Visit (INDEPENDENT_AMBULATORY_CARE_PROVIDER_SITE_OTHER): Payer: Medicare Other | Admitting: Family

## 2023-08-10 VITALS — BP 127/75 | HR 71 | Temp 97.7°F | Ht 67.0 in | Wt 156.0 lb

## 2023-08-10 DIAGNOSIS — G3184 Mild cognitive impairment, so stated: Secondary | ICD-10-CM

## 2023-08-10 DIAGNOSIS — E782 Mixed hyperlipidemia: Secondary | ICD-10-CM

## 2023-08-10 DIAGNOSIS — I10 Essential (primary) hypertension: Secondary | ICD-10-CM | POA: Diagnosis not present

## 2023-08-10 DIAGNOSIS — R630 Anorexia: Secondary | ICD-10-CM | POA: Diagnosis not present

## 2023-08-10 LAB — COMPREHENSIVE METABOLIC PANEL
ALT: 10 U/L (ref 0–35)
AST: 15 U/L (ref 0–37)
Albumin: 3.8 g/dL (ref 3.5–5.2)
Alkaline Phosphatase: 43 U/L (ref 39–117)
BUN: 17 mg/dL (ref 6–23)
CO2: 29 meq/L (ref 19–32)
Calcium: 9.8 mg/dL (ref 8.4–10.5)
Chloride: 104 meq/L (ref 96–112)
Creatinine, Ser: 0.91 mg/dL (ref 0.40–1.20)
GFR: 62.72 mL/min (ref >60.00)
Glucose, Bld: 87 mg/dL (ref 70–99)
Potassium: 4 meq/L (ref 3.5–5.1)
Sodium: 141 meq/L (ref 135–145)
Total Bilirubin: 0.6 mg/dL (ref 0.2–1.2)
Total Protein: 7.3 g/dL (ref 6.0–8.3)

## 2023-08-10 LAB — LIPID PANEL
Cholesterol: 109 mg/dL (ref 0–200)
HDL: 37.5 mg/dL — ABNORMAL LOW (ref >39.00)
LDL Cholesterol: 54 mg/dL (ref 0–99)
NonHDL: 71.72
Total CHOL/HDL Ratio: 3
Triglycerides: 87 mg/dL (ref 0.0–149.0)
VLDL: 17.4 mg/dL (ref 0.0–40.0)

## 2023-08-10 MED ORDER — DONEPEZIL HCL 5 MG PO TABS
5.0000 mg | ORAL_TABLET | Freq: Every day | ORAL | 1 refills | Status: DC
Start: 2023-08-10 — End: 2023-10-18

## 2023-08-10 MED ORDER — MIRTAZAPINE 7.5 MG PO TABS: 7.5000 mg | ORAL_TABLET | Freq: Every day | ORAL | 1 refills | Status: DC

## 2023-08-10 NOTE — Assessment & Plan Note (Signed)
chronic, stable HCTZ and Amlodipine stopped last visit BP good today checking labs today f/u 4 months

## 2023-08-10 NOTE — Patient Instructions (Signed)
It was very nice to see you today!   I will review your lab results via MyChart in a few days.  Schedule a 4 month follow up visit today.      PLEASE NOTE:  If you had any lab tests please let us know if you have not heard back within a few days. You may see your results on MyChart before we have a chance to review them but we will give you a call once they are reviewed by [place]. If we ordered any referrals today, please let us know if you have not heard from their office within the next week.

## 2023-08-10 NOTE — Assessment & Plan Note (Signed)
chronic taking Crestor 5mg  qd last lipids wnl sending refill f/u 1 yr for fasting labs

## 2023-08-10 NOTE — Progress Notes (Addendum)
Patient ID: Sonya Woods, female    DOB: 01/04/1951, 72 y.o.   MRN: [id number]  Chief Complaint  Patient presents with   Hypertension    Fasting w/ labs  Discussed the use of AI scribe software for clinical note transcription with the patient, who gave verbal consent to proceed.  History of Present Illness   The patient, with a history of memory problems and a previous fracture, reports experiencing excruciating pain in her lower spine when standing up from a low sitting position. The pain is localized to the area of the previous fracture. The patient notes that the pain is not present when walking or sitting for extended periods, but is particularly severe when rising from a low position. The patient uses a walker for support and has a brace for additional support. The patient also reports falling asleep fine, but she is waking up at 3 am daily due to a long-standing habit from previous work schedules. The patient lives at home and has a caregiver who assists with appointments, ADLs, and reminders for medication. The patient manages meal preparation and bathing independently, having made modifications to the home environment such as installing a walk-in shower for ease of use.    Memory concerns:  HX:  per sister, after her fall pt had been confused as to the events of her fall.  Sister thinks she fell actually 2 days before she notified anyone. She was concerned if she fell and hit her head and just didn't not remember. She had off & on nausea & dizziness and memory issues. Sister stated she had not noticed any other TIA or CVA sx like speech problems, facial or peripheral neuropathy or facial drooping. She was started on Aricept 5mg  every day in SNF. CT scan of head just recently was wnl. *Pt with hired cg, had neurocognitive testing done earlier in week, will discuss results next week at f/u. Pt states she was told her cognition is in line with her age. Hypertension: Patient is currently  maintained on the following medications for blood pressure: Amlodipine 5mg  daily, Lisinopril-hydrochlorothiazide 10-12.5mg  daily. Failed meds include: none Patient reports good compliance with blood pressure medications. Patient denies chest pain, headaches, shortness of breath or swelling. Last 3 blood pressure readings in our office are as follows: BP Readings from Last 3 Encounters:  08/10/23 127/75  05/11/23 (!) 163/76  04/15/23 (!) 90/50  Hyperlipidemia: Patient is currently maintained on the following medication for hyperlipidemia: Crestor 5mg . Patient denies myalgia or other side effects. Patient reports good compliance with low fat/low cholesterol diet.  Last lipid panel as follows: Lab Results  Component Value Date   CHOL 122 08/04/2022   HDL 39.70 08/04/2022   LDLCALC 59 08/04/2022   TRIG 116.0 08/04/2022   CHOLHDL 3 08/04/2022   Assessment & Plan:  Essential hypertension Assessment & Plan: chronic, stable HCTZ and Amlodipine stopped last visit BP good today checking labs today f/u 4 months  Orders: -     Comprehensive metabolic panel  Mixed hyperlipidemia Assessment & Plan: chronic taking Crestor 5mg  qd last lipids wnl sending refill f/u 1 yr for fasting labs   Orders: -     Lipid panel  Amnestic MCI (mild cognitive impairment with memory loss) Assessment & Plan: chronic over the past year followed by Neuro & had recent detailed cognitive testing done indicating below avg abilities w/memory, but avg for intellectual, safety & judgment taking Aricept 5mg  qd pt has a hired cg in the home to help  with ADLs, transportation, meds, etc. will continue to follow  Orders: -     Donepezil HCl; Take 1 tablet (5 mg total) by mouth at bedtime.  Dispense: 90 tablet; Refill: 1  Decreased appetite Assessment & Plan: started on Mirtazapine 7.5mg  in SNF weight had been holding steady, down 12lbs since last visit 3 mos ago will refill med today, encourage regular  meals, at least 3-4 daily f/u 4 mos  Orders: -     Mirtazapine; Take 1 tablet (7.5 mg total) by mouth at bedtime.  Dispense: 90 tablet; Refill: 1   Subjective:    Outpatient Medications Prior to Visit  Medication Sig Dispense Refill   acetaminophen (TYLENOL) 500 MG tablet Take 1,000 mg by mouth every 6 (six) hours as needed for mild pain.     B Complex-C (B-COMPLEX WITH VITAMIN C) tablet Take 1 tablet by mouth daily.     cholecalciferol (VITAMIN D) 1000 units tablet Take 2,000 Units by mouth daily.     Coenzyme Q10 (CO Q 10 PO) Take 200 mg by mouth.     lidocaine (LIDOCAINE PAIN RELIEF) 4 % Place 1 patch onto the skin daily. Remove old patch prior to placing new one. 14 patch 0   rosuvastatin (CRESTOR) 5 MG tablet Take 1 pill daily. For cholesterol. 90 tablet 1   amLODipine (NORVASC) 5 MG tablet      donepezil (ARICEPT) 5 MG tablet TAKE 1 TABLET(5 MG) BY MOUTH DAILY FOR MEMORY 90 tablet 0   hydrochlorothiazide (HYDRODIURIL) 12.5 MG tablet      mirtazapine (REMERON) 7.5 MG tablet TAKE 1 TABLET(7.5 MG) BY MOUTH AT BEDTIME FOR SLEEP AND APPETITE 90 tablet 0   No facility-administered medications prior to visit.   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   Hypokalemia 07/24/2021   Low vitamin D level    Measles    Mixed hyperlipidemia 08/03/2018   Pain due to onychomycosis of toenails of both feet 01/27/2021   Prediabetes 08/03/2018   Past Surgical  History:  Procedure Laterality Date   APPENDECTOMY     BIOPSY  07/26/2021   Procedure: BIOPSY;  Surgeon: Benancio Deeds, MD;  Location: [facility];  Service: Gastroenterology;;   CATARACT EXTRACTION Right    CESAREAN SECTION  1976   FLEXIBLE SIGMOIDOSCOPY N/A 07/26/2021   Procedure: FLEXIBLE SIGMOIDOSCOPY;  Surgeon: Benancio Deeds, MD;  Location: [facility];  Service: Gastroenterology;  Laterality: N/A;   MOHS SURGERY  06/2017   nose    S/P Bone Tumor Surgery      Allergies  Allergen Reactions   Angiotensin Receptor Blockers    Losartan Potassium-Hctz    Tetanus Toxoids    Tramadol Other (See Comments)      Objective:    Physical Exam Vitals and nursing note reviewed.  Constitutional:      Appearance: Normal appearance.  Cardiovascular:     Rate  and Rhythm: Normal rate and regular rhythm.  Pulmonary:     Effort: Pulmonary effort is normal.     Breath sounds: Normal breath sounds.  Musculoskeletal:        General: Normal range of motion.  Skin:    General: Skin is warm and dry.  Neurological:     Mental Status: She is alert.  Psychiatric:        Mood and Affect: Mood normal.        Behavior: Behavior normal.    BP 127/75 (BP Location: Left Arm, Patient Position: Sitting, Cuff Size: Large)   Pulse 71   Temp 97.7 F (36.5 C) (Temporal)   Ht 5\' 7"  (1.702 m)   Wt 156 lb (70.8 kg)   SpO2 98%   BMI 24.43 kg/m  Wt Readings from Last 3 Encounters:  08/10/23 156 lb (70.8 kg)  05/11/23 168 lb (76.2 kg)  04/15/23 173 lb (78.5 kg)       Dulce Sellar, NP

## 2023-08-10 NOTE — Assessment & Plan Note (Addendum)
chronic over the past year followed by Neuro & had recent detailed cognitive testing done indicating below avg abilities w/memory, but avg for intellectual, safety & judgment taking Aricept 5mg  qd pt has a hired cg in the home to help with ADLs, transportation, meds, etc. will continue to follow

## 2023-08-10 NOTE — Assessment & Plan Note (Signed)
started on Mirtazapine 7.5mg  in SNF weight had been holding steady, down 12lbs since last visit 3 mos ago will refill med today, encourage regular meals, at least 3-4 daily f/u 4 mos

## 2023-08-13 ENCOUNTER — Ambulatory Visit: Payer: Medicare Other | Admitting: Psychology

## 2023-08-13 DIAGNOSIS — G3184 Mild cognitive impairment, so stated: Secondary | ICD-10-CM | POA: Diagnosis not present

## 2023-08-13 NOTE — Progress Notes (Signed)
   Neuropsychology Feedback Session Eligha Bridegroom. [facility] Department of Neurology  Reason for Referral:   Sonya Woods is a 72 y.o. right-handed Caucasian female referred by Marlowe Kays, PA-C, to characterize her current cognitive functioning and assist with diagnostic clarity and treatment planning in the context of subjective cognitive decline.   Feedback:   Sonya Woods completed a comprehensive neuropsychological evaluation on 08/06/2023. Please refer to that encounter for the full report and recommendations. Briefly, results suggested severe impairment surrounding both delayed retrieval and recognition/consolidation aspects of memory. A further impairment was exhibited across verbal fluency (semantic worse than phonemic), while variability was exhibited across encoding (i.e., learning) aspects of memory. Regarding the underlying cause for memory impairment, current testing patterns do raise concern for underlying Alzheimer's disease. Despite Sonya Woods demonstrating an ability to learn structured, contextualized information well initially, she was fully amnestic (i.e., 0% retention) across all three administered memory tasks and performed very poorly across yes/no recognition trials. This suggests evidence for rapid forgetting and a pronounced storage impairment, both of which are the hallmark testing patterns of this illness. Further impairment surrounding semantic fluency would follow typical disease progression. Intact confrontation naming is encouraging and could suggest this disease process being in relatively early stages. Continued medical monitoring will be important moving forward.  Sonya Woods was accompanied by her caretaker during the current feedback session. Content of the current session focused on the results of her neuropsychological evaluation. Sonya Woods was given the opportunity to ask questions and her questions were answered. She was encouraged to reach  out should additional questions arise. A copy of her report was provided at the conclusion of the visit.      One unit [phone] was billed for Dr. Tammy Sours time spent preparing for, conducting, and documenting the current feedback session with Sonya Woods.

## 2023-10-18 ENCOUNTER — Ambulatory Visit (INDEPENDENT_AMBULATORY_CARE_PROVIDER_SITE_OTHER): Payer: Medicare Other | Admitting: Family

## 2023-10-18 ENCOUNTER — Encounter: Payer: Self-pay | Admitting: Family

## 2023-10-18 VITALS — BP 123/81 | HR 80 | Temp 97.0°F | Ht 67.0 in | Wt 151.0 lb

## 2023-10-18 DIAGNOSIS — R630 Anorexia: Secondary | ICD-10-CM | POA: Diagnosis not present

## 2023-10-18 DIAGNOSIS — M25462 Effusion, left knee: Secondary | ICD-10-CM

## 2023-10-18 DIAGNOSIS — M25562 Pain in left knee: Secondary | ICD-10-CM

## 2023-10-18 DIAGNOSIS — E782 Mixed hyperlipidemia: Secondary | ICD-10-CM

## 2023-10-18 DIAGNOSIS — G3184 Mild cognitive impairment, so stated: Secondary | ICD-10-CM

## 2023-10-18 MED ORDER — DONEPEZIL HCL 5 MG PO TABS
5.0000 mg | ORAL_TABLET | Freq: Every day | ORAL | 1 refills | Status: DC
Start: 2023-10-18 — End: 2024-03-31

## 2023-10-18 MED ORDER — ROSUVASTATIN CALCIUM 5 MG PO TABS: ORAL_TABLET | ORAL | 1 refills | Status: DC

## 2023-10-18 MED ORDER — MIRTAZAPINE 7.5 MG PO TABS: 7.5000 mg | ORAL_TABLET | Freq: Every day | ORAL | 1 refills | Status: DC

## 2023-10-18 NOTE — Patient Instructions (Signed)
 It was very nice to see you today!   I have sent a referral to our Sports medicine office. See the phone number attached - call them after next week if you have not heard anything.  Schedule a 6 month follow up visit today.      PLEASE NOTE:  If you had any lab tests please let us  know if you have not heard back within a few days. You may see your results on MyChart before we have a chance to review them but we will give you a call once they are reviewed by us . If we ordered any referrals today, please let us  know if you have not heard from their office within the next week.

## 2023-10-18 NOTE — Progress Notes (Signed)
 Patient ID: Sonya Woods, female    DOB: 09-21-1951, 73 y.o.   MRN: [id number]  Chief Complaint  Patient presents with   Joint Swelling    Pt c/o left knee pain and swelling for 2 days. Has tried ice which did not help sx.    Discussed the use of AI scribe software for clinical note transcription with the patient, who gave verbal consent to proceed.  History of Present Illness   Sonya Woods, a patient with a history of lumbar compression fracture, hyperlipidemia, cognitive decline and decreased appetite, presents with her hired cg for left knee swelling. She reports no recent trauma or increased activity that could have caused the knee issue. She describes  having a little pain when she tries to completely extend the knee and she feels it in the front of her knee, extending upwards. The pain is particularly noticeable when she tries to lift her leg. She has been using a compression sleeve for support, which she reports helps to some extent, but she is concerned about the possibility of falling due to instability. She denies any redness or heat in the knee area. In addition to her knee issue, Sonya Woods also discusses her medication regimen. She is currently on rosuvastatin , mirtazapine , and donepezil . She reports that she has about one week's worth of medication left and is requesting refills.    Memory concerns:  sx first noticed by sister after pt had back fx and sent to SNF for rehab. She was started on Aricept  5mg  every day in SNF. CT scan of head was wnl.  Hyperlipidemia: Patient is currently maintained on the following medication for hyperlipidemia: Crestor  5mg . Patient denies myalgia or other side effects. Patient reports good compliance with low fat/low cholesterol diet.  Last lipid panel as follows: Lab Results  Component Value Date   CHOL 109 08/10/2023   HDL 37.50 (L) 08/10/2023   LDLCALC 54 08/10/2023   TRIG 87.0 08/10/2023   CHOLHDL 3 08/10/2023    Assessment & Plan:     Knee Pain and  Swelling - Recent onset of left knee pain and swelling without a clear precipitating event. No redness or heat noted on examination. Swelling localized to the anterior, and proximal to knee anteriorly, as well as medial aspect of the knee. -Refer to Sports Medicine for further evaluation and management, including possible imaging and/or aspiration. -Advised to continue using knee compression as long as it provides support and does not cause lower leg swelling. -Recommend elevating the knee when at home to help reduce swelling.  Hyperlipidemia -  -Refill rosuvastatin   -Schedule follow-up appointment in June 2025 with fasting labs.  Cognitive impairment - -Refill Donepezil  5mg  qd. -F/U in 6 mos  Decreased appetite - -Refill mirtazapine  15mg  qd. -Advised patient to use a pill organizer to manage medications effectively. -F/U in 6 mos      Subjective:    Outpatient Medications Prior to Visit  Medication Sig Dispense Refill   acetaminophen  (TYLENOL ) 500 MG tablet Take 1,000 mg by mouth every 6 (six) hours as needed for mild pain.     B Complex-C (B-COMPLEX WITH VITAMIN C) tablet Take 1 tablet by mouth daily.     cholecalciferol (VITAMIN D ) 1000 units tablet Take 2,000 Units by mouth daily.     Coenzyme Q10 (CO Q 10 PO) Take 200 mg by mouth.     donepezil  (ARICEPT ) 5 MG tablet Take 1 tablet (5 mg total) by mouth at bedtime. 90 tablet 1   lidocaine  (  LIDOCAINE  PAIN RELIEF) 4 % Place 1 patch onto the skin daily. Remove old patch prior to placing new one. 14 patch 0   mirtazapine  (REMERON ) 7.5 MG tablet Take 1 tablet (7.5 mg total) by mouth at bedtime. 90 tablet 1   rosuvastatin  (CRESTOR ) 5 MG tablet Take 1 pill daily. For cholesterol. 90 tablet 1   No facility-administered medications prior to visit.   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   Hypokalemia 07/24/2021   Low vitamin D  level    Measles    Mixed hyperlipidemia 08/03/2018   Pain due to onychomycosis of toenails of both feet 01/27/2021   Prediabetes 08/03/2018   Past Surgical History:  Procedure Laterality Date   APPENDECTOMY     BIOPSY  07/26/2021   Procedure: BIOPSY;  Surgeon: Leigh Elspeth SQUIBB, MD;  Location: [facility];  Service: Gastroenterology;;   CATARACT EXTRACTION Right    CESAREAN SECTION  1976   FLEXIBLE SIGMOIDOSCOPY N/A 07/26/2021   Procedure: FLEXIBLE SIGMOIDOSCOPY;  Surgeon: Leigh Elspeth SQUIBB, MD;  Location: [facility];  Service: Gastroenterology;  Laterality: N/A;   MOHS SURGERY  06/2017   nose    S/P Bone Tumor Surgery      Allergies  Allergen Reactions   Angiotensin Receptor Blockers    Losartan Potassium-Hctz    Tetanus Toxoids    Tramadol Other (See Comments)      Objective:    Physical Exam Vitals and nursing note reviewed.  Constitutional:      Appearance: Normal appearance.  Cardiovascular:     Rate and Rhythm: Normal rate and regular rhythm.  Pulmonary:     Effort: Pulmonary effort is normal.     Breath sounds: Normal breath sounds.  Musculoskeletal:     Left knee: Effusion present. No erythema. Decreased range of motion. Tenderness present over the medial joint line, MCL, ACL and patellar tendon.  Skin:    General: Skin is warm and dry.  Neurological:     Mental Status: She is alert.  Psychiatric:        Mood and Affect: Mood normal.        Behavior: Behavior normal.    BP 123/81 (BP Location: Left Arm,  Patient Position: Sitting, Cuff Size: Normal)   Pulse 80   Temp (!) 97 F (36.1 C) (Temporal)   Ht 5' 7 (1.702 m)   Wt 151 lb (68.5 kg)   SpO2 98%   BMI 23.65 kg/m  Wt Readings from Last 3 Encounters:  10/18/23 151 lb (68.5 kg)  08/10/23 156 lb (70.8 kg)  05/11/23 168 lb (76.2 kg)      Lucius Krabbe, NP

## 2023-11-03 ENCOUNTER — Telehealth: Payer: Self-pay | Admitting: Family

## 2023-11-03 NOTE — Telephone Encounter (Unsigned)
Copied from CRM [id number]. Topic: General - Other >> Nov 03, 2023 12:00 PM Samuel Jester B wrote: Reason for CRM: Pt daughter Archie Patten called and stated that Clydie Braun called her regarding the pt medicare annual visit. She would like a callback to speak with Clydie Braun about what this appointment specifically is.

## 2023-11-04 ENCOUNTER — Ambulatory Visit: Payer: Medicare Other

## 2023-11-04 VITALS — Wt 151.0 lb

## 2023-11-04 DIAGNOSIS — Z0000 Encounter for general adult medical examination without abnormal findings: Secondary | ICD-10-CM

## 2023-11-04 NOTE — Progress Notes (Addendum)
Subjective:   Sonya Woods is a 73 y.o. female who presents for Medicare Annual (Subsequent) preventive examination.  Visit Complete: Virtual I connected with  Sonya Woods on 11/09/23 by a audio enabled telemedicine application and verified that I am speaking with the correct person using two identifiers. Pt Daughter Sonya Woods completed for mom, unable to connect with her in conference call   Patient Location: Home  Provider Location: Office/Clinic  I discussed the limitations of evaluation and management by telemedicine. The patient expressed understanding and agreed to proceed.  Vital Signs: Because this visit was a virtual/telehealth visit, some criteria may be missing or patient reported. Any vitals not documented were not able to be obtained and vitals that have been documented are patient reported.    Cardiac Risk Factors include: advanced age (>8men, >49 women);dyslipidemia;hypertension     Objective:    Today's Vitals   11/04/23 1108  Weight: 151 lb (68.5 kg)   Body mass index is 23.65 kg/m.     11/04/2023   11:15 AM 05/11/2023    7:47 AM 03/01/2023    4:44 PM 10/27/2022   10:35 AM 07/24/2021   10:18 AM  Advanced Directives  Does Patient Have a Medical Advance Directive? Yes No No Yes No  Type of Estate agent of [place];Living will   Healthcare Power of [place];Living will   Copy of Healthcare Power of Attorney in Chart? No - copy requested   No - copy requested   Would patient like information on creating a medical advance directive?  No - Patient declined No - Patient declined  No - Patient declined    Current Medications (verified) Outpatient Encounter Medications as of 11/04/2023  Medication Sig   B Complex-C (B-COMPLEX WITH VITAMIN C) tablet Take 1 tablet by mouth daily.   cholecalciferol (VITAMIN D) 1000 units tablet Take 2,000 Units by mouth daily. d3   Coenzyme Q10 (CO Q 10 PO) Take 200 mg by mouth.   donepezil (ARICEPT) 5 MG tablet  Take 1 tablet (5 mg total) by mouth at bedtime.   mirtazapine (REMERON) 7.5 MG tablet Take 1 tablet (7.5 mg total) by mouth at bedtime.   rosuvastatin (CRESTOR) 5 MG tablet Take 1 pill daily. For cholesterol.   [DISCONTINUED] acetaminophen (TYLENOL) 500 MG tablet Take 1,000 mg by mouth every 6 (six) hours as needed for mild pain.   [DISCONTINUED] lidocaine (LIDOCAINE PAIN RELIEF) 4 % Place 1 patch onto the skin daily. Remove old patch prior to placing new one.   No facility-administered encounter medications on file as of 11/04/2023.    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   Hypokalemia 07/24/2021   Low vitamin D level    Measles    Mixed hyperlipidemia 08/03/2018   Pain due to onychomycosis of toenails of both feet  01/27/2021   Prediabetes 08/03/2018   Past Surgical History:  Procedure Laterality Date   APPENDECTOMY     BIOPSY  07/26/2021   Procedure: BIOPSY;  Surgeon: Benancio Deeds, MD;  Location: [facility];  Service: Gastroenterology;;   CATARACT EXTRACTION Right    CESAREAN SECTION  1976   FLEXIBLE SIGMOIDOSCOPY N/A 07/26/2021   Procedure: FLEXIBLE SIGMOIDOSCOPY;  Surgeon: Benancio Deeds, MD;  Location: [facility];  Service: Gastroenterology;  Laterality: N/A;   MOHS SURGERY  06/2017   nose    S/P Bone Tumor  Surgery      Family History  Problem Relation Age of Onset   Dementia Mother        possible Alzheimer's disease   Mental illness Mother    Hypertension Father    Hyperlipidemia Father    Heart attack Father    Social History   Socioeconomic History   Marital status: Divorced    Spouse name: Not on file   Number of children: 2   Years of education: 12   Highest education level: High school graduate  Occupational History   Occupation: Retired    Comment: Bookkeeping/Accountant  Tobacco Use   Smoking status: Former    Current packs/day: 0.00    Types: Cigarettes    Quit date: 08/03/2011    Years since quitting: 12.2   Smokeless tobacco: Never  Vaping Use   Vaping status: Never Used  Substance and Sexual Activity   Alcohol use: Never   Drug use: Never   Sexual activity: Not Currently    Partners: Male  Other Topics Concern   Not on file  Social History Narrative   Right handed   Drinks decaf    [place] home   Lives alone   Retired   Social Drivers of Health   Financial Resource Strain: Low Risk  (11/04/2023)   Overall Financial Resource Strain (CARDIA)    Difficulty of Paying Living Expenses: Not hard at all  Food Insecurity: No Food Insecurity (11/04/2023)   Hunger Vital Sign    Worried About Running Out of Food in the Last Year: Never true    Ran Out of Food in the Last Year: Never true  Transportation Needs: No Transportation Needs (11/04/2023)   PRAPARE - Administrator, Civil Service (Medical): No    Lack of Transportation (Non-Medical): No  Physical Activity: Inactive (11/04/2023)   Exercise Vital Sign    Days of Exercise per Week: 0 days    Minutes of Exercise per Session: 0 min  Stress: No Stress Concern Present (11/04/2023)   [employer] of Occupational Health - Occupational Stress Questionnaire    Feeling of Stress : Only a little  Social Connections: Socially Isolated (11/04/2023)   Social Connection and Isolation Panel [NHANES]     Frequency of Communication with Friends and Family: More than three times a week    Frequency of Social Gatherings with Friends and Family: Once a week    Attends Religious Services: Never    Database administrator or Organizations: No    Attends Engineer, structural: Never    Marital Status: Divorced    Tobacco Counseling Counseling given: Not Answered   Clinical Intake:  Pre-visit preparation completed: Yes  Pain : No/denies pain     BMI - recorded: 23.65 Nutritional Status: BMI of 19-24  Normal Nutritional Risks: None Diabetes: No  How often do you need to have someone help you when you read instructions,  pamphlets, or other written materials from your doctor or pharmacy?: 1 - Never  Interpreter Needed?: No  Information entered by :: Lanier Ensign, LPN   Activities of Daily Living    11/04/2023   11:10 AM  In your present state of health, do you have any difficulty performing the following activities:  Hearing? 0  Vision? 0  Difficulty concentrating or making decisions? 1  Comment early on sit alzheimers  Walking or climbing stairs? 0  Dressing or bathing? 0  Doing errands, shopping? 0  Preparing Food and eating ? N  Using the Toilet? N  In the past six months, have you accidently leaked urine? N  Do you have problems with loss of bowel control? N  Managing your Medications? N  Managing your Finances? N  Housekeeping or managing your Housekeeping? N    Patient Care Team: Dulce Sellar, NP as PCP - General (Family Medicine)  Indicate any recent Medical Services you may have received from other than [facility] providers in the past year (date may be approximate).     Assessment:   This is a routine wellness examination for Quentin.  Hearing/Vision screen Hearing Screening - Comments:: Pt denies any hearing issues  Vision Screening - Comments:: Encouraged to follow up and request a referral    Goals Addressed             This Visit's  Progress    Patient Stated       Keep independence and activity        Depression Screen    11/04/2023   11:13 AM 08/10/2023    8:04 AM 10/27/2022   10:32 AM 10/12/2022    9:56 AM 08/26/2021   10:04 AM 03/11/2020    8:58 AM 02/08/2020    1:14 PM  PHQ 2/9 Scores  PHQ - 2 Score 1 0 0 0 0 0 0  PHQ- 9 Score  1         Fall Risk    11/04/2023   11:17 AM 05/11/2023    7:47 AM 10/27/2022   10:36 AM 10/12/2022    9:56 AM 08/26/2021   10:04 AM  Fall Risk   Falls in the past year? 1 0 0 0 0  Number falls in past yr: 1 0 0 0   Injury with Fall? 1 0 0 0   Comment back injury      Risk for fall due to : History of fall(s)  Impaired vision No Fall Risks   Follow up Falls prevention discussed Falls evaluation completed Falls prevention discussed Education provided     MEDICARE RISK AT HOME: Medicare Risk at Home Any stairs in or around the home?: No If so, are there any without handrails?: No Home free of loose throw rugs in walkways, pet beds, electrical cords, etc?: Yes Adequate lighting in your home to reduce risk of falls?: Yes Life alert?: Yes Use of a cane, walker or w/c?: Yes Grab bars in the bathroom?: Yes Shower chair or bench in shower?: Yes Elevated toilet seat or a handicapped toilet?: Yes  TIMED UP AND GO:  Was the test performed?  No    Cognitive Function: Unable to assess     11/04/2023   11:18 AM  MMSE - Mini Mental State Exam  Not completed: Unable to complete      05/19/2023    8:00 AM 05/11/2023    7:00 AM  Montreal Cognitive Assessment   Visuospatial/ Executive (0/5) 1 2  Naming (  0/3) 3 3  Attention: Read list of digits (0/2) 2 2  Attention: Read list of letters (0/1) 1 1  Attention: Serial 7 subtraction starting at 100 (0/3) 1 1  Language: Repeat phrase (0/2) 2 2  Language : Fluency (0/1) 0 0  Abstraction (0/2) 2 2  Delayed Recall (0/5) 1 1  Orientation (0/6) 5 5  Total 18 19  Adjusted Score (based on education) 19 20      10/27/2022   10:37 AM   6CIT Screen  What Year? 0 points  What month? 0 points  What time? 0 points  Count back from 20 0 points  Months in reverse 0 points  Repeat phrase 0 points  Total Score 0 points    Immunizations Immunization History  Administered Date(s) Administered   Fluad Quad(high Dose 65+) 08/26/2021   Influenza, High Dose Seasonal PF 07/08/2018, 05/31/2019   Influenza-Unspecified 06/30/2022   PFIZER(Purple Top)SARS-COV-2 Vaccination 11/16/2019, 12/11/2019   Pfizer(Comirnaty)Fall Seasonal Vaccine 12 years and older 06/30/2022   Pneumococcal Polysaccharide-23 02/08/2020   RSV,unspecified 06/30/2022   Zoster Recombinant(Shingrix) 10/24/2021, 01/05/2022      Flu Vaccine status: Due, Education has been provided regarding the importance of this vaccine. Advised may receive this vaccine at local pharmacy or Health Dept. Aware to provide a copy of the vaccination record if obtained from local pharmacy or Health Dept. Verbalized acceptance and understanding.  Pneumococcal vaccine status: Due, Education has been provided regarding the importance of this vaccine. Advised may receive this vaccine at local pharmacy or Health Dept. Aware to provide a copy of the vaccination record if obtained from local pharmacy or Health Dept. Verbalized acceptance and understanding.  Covid-19 vaccine status: Information provided on how to obtain vaccines.   Qualifies for Shingles Vaccine? Yes   Zostavax completed Yes   Shingrix Completed?: Yes  Screening Tests Health Maintenance  Topic Date Due   Colonoscopy  Never done   MAMMOGRAM  Never done   DEXA SCAN  Never done   COVID-19 Vaccine (4 - 2024-25 season) 06/06/2023   INFLUENZA VACCINE  01/03/2024 (Originally 05/06/2023)   Pneumonia Vaccine 48+ Years old (2 of 2 - PCV) 08/09/2024 (Originally 02/07/2021)   Medicare Annual Wellness (AWV)  11/03/2024   Hepatitis C Screening  Completed   Zoster Vaccines- Shingrix  Completed   HPV VACCINES  Aged Out    DTaP/Tdap/Td  Discontinued    Health Maintenance  Health Maintenance Due  Topic Date Due   Colonoscopy  Never done   MAMMOGRAM  Never done   DEXA SCAN  Never done   COVID-19 Vaccine (4 - 2024-25 season) 06/06/2023    Colorectal cancer screening: No longer required.   Mammogram status: No longer required due to per pt .     Additional Screening:  Hepatitis C Screening: ; Completed 02/02/18  Vision Screening: Recommended annual ophthalmology exams for early detection of glaucoma and other disorders of the eye. Is the patient up to date with their annual eye exam?  No  Who is the provider or what is the name of the office in which the patient attends annual eye exams? Pt request referral  If pt is not established with a provider, would they like to be referred to a provider to establish care? Yes .   Dental Screening: Recommended annual dental exams for proper oral hygiene   Community Resource Referral / Chronic Care Management: CRR required this visit?  No   CCM required this visit?  No  Plan:     I have personally reviewed and noted the following in the patient's chart:   Medical and social history Use of alcohol, tobacco or illicit drugs  Current medications and supplements including opioid prescriptions. Patient is not currently taking opioid prescriptions. Functional ability and status Nutritional status Physical activity Advanced directives List of other physicians Hospitalizations, surgeries, and ER visits in previous 12 months Vitals Screenings to include cognitive, depression, and falls Referrals and appointments  In addition, I have reviewed and discussed with patient certain preventive protocols, quality metrics, and best practice recommendations. A written personalized care plan for preventive services as well as general preventive health recommendations were provided to patient.     Marzella Schlein, LPN   10/13/1476   After Visit Summary:  (MyChart) Due to this being a telephonic visit, the after visit summary with patients personalized plan was offered to patient via MyChart   Nurse Notes: Pt is requesting a referral for an eye provider, Pt was unable to complete cognition due to her daughter completed AWV

## 2023-11-04 NOTE — Patient Instructions (Signed)
Ms. Summons , Thank you for taking time to come for your Medicare Wellness Visit. I appreciate your ongoing commitment to your health goals. Please review the following plan we discussed and let me know if I can assist you in the future.   Referrals/Orders/Follow-Ups/Clinician Recommendations: maintain independence and activity as much  as possible   This is a list of the screening recommended for you and due dates:  Health Maintenance  Topic Date Due   Colon Cancer Screening  Never done   Mammogram  Never done   DEXA scan (bone density measurement)  Never done   COVID-19 Vaccine (4 - 2024-25 season) 06/06/2023   Flu Shot  01/03/2024*   Pneumonia Vaccine (2 of 2 - PCV) 08/09/2024*   Medicare Annual Wellness Visit  11/03/2024   Hepatitis C Screening  Completed   Zoster (Shingles) Vaccine  Completed   HPV Vaccine  Aged Out   DTaP/Tdap/Td vaccine  Discontinued  *Topic was postponed. The date shown is not the original due date.    Advanced directives: (Copy Requested) Please bring a copy of your health care power of attorney and living will to the office to be added to your chart at your convenience.  Next Medicare Annual Wellness Visit scheduled for next year: Yes

## 2023-11-09 ENCOUNTER — Encounter: Payer: Self-pay | Admitting: Physician Assistant

## 2023-11-11 ENCOUNTER — Ambulatory Visit: Payer: Medicare Other | Admitting: Physician Assistant

## 2023-11-11 ENCOUNTER — Encounter: Payer: Self-pay | Admitting: Physician Assistant

## 2023-11-11 ENCOUNTER — Telehealth: Payer: Self-pay | Admitting: Physician Assistant

## 2023-11-11 VITALS — BP 142/72 | HR 70 | Resp 18 | Ht 67.0 in | Wt 145.0 lb

## 2023-11-11 DIAGNOSIS — G3184 Mild cognitive impairment, so stated: Secondary | ICD-10-CM | POA: Diagnosis not present

## 2023-11-11 NOTE — Progress Notes (Signed)
 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 RH female with a history of hypertension, hyperlipidemia, vitamin D  deficiency, prediabetes,  presenting today in follow-up for evaluation of memory loss. Patient is on donepezil  5 mg daily. Memory is stable except for some episodes of confabulation. Patient is able to participate on ADLs , no longer drives.        Recommendations:   Follow up in 6  months. Repeat neuropsych evaluation in 10 to 16 months for diagnostic clarity and disease trajectory Replenish B12 (367) Recommend increasing donepezil  to 10 mg daily by PCP, side effects discussed To date, the patient has not had her MRI of the brain, that would help delineate any structural abnormalities and vascular load Recommend good control of cardiovascular risk factors Continue to control mood as per PCP    Subjective:   This patient is accompanied in the office by her caregiver who supplements the history. Previous records as well as any outside records available were reviewed prior to todays visit. Patient was last seen on 05/11/23 with [facility] 20/30     Any changes in memory since last visit? I think it is ok. LTM is better. Daughter reported by Sonya Woods that he has been more forgetful (caregiver disagrees) and has been confabulating making up stories that they are not factual, for example, that IRS is coming after her and she was going to prison repeats oneself?  Does not know.  Disoriented when walking into a room?  Patient denies.    Misplacing objects?  Patient denies.   Wandering behavior?   denies   Any personality changes since last visit?  Denies.   Any worsening depression?: denies.   Hallucinations or paranoia?  The day after his confabulatory story she may feel paranoid One day she seems fine, hearing, the next day she is depressed and the day after she is paranoid, then it seems that the cycle  starts over -daughter said. Seizures?  denies    Any sleep changes? Sleeps well .Takes Remeron . Denies vivid dreams, REM behavior or sleepwalking   Sleep apnea?   denies   Any hygiene concerns?   denies   Independent of bathing and dressing?  Endorsed  Does the patient needs help with medications? Patient is in charge, she has a pill tray.  Who is in charge of the finances?  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  She is afraid that she is going to go to jail if she did not pay a water bill. Daughter is thinking about taking over will help her with her paranoia.  Any changes in appetite?  Denies.  She takes mirtazapine  with good results   Patient have trouble swallowing?  denies   Does the patient cook?  Not much . Any kitchen accidents such as leaving the stove on?  denies   Any headaches?    denies   Vision changes? denies Chronic pain?  denies   Ambulates with difficulty?    denies.  Uses a walker when she goes outside for stability   Recent falls or head injuries?    denies      Unilateral weakness, numbness or tingling?   denies   Any tremors?  denies   Any anosmia?    denies   Any incontinence of urine?  denies   Any bowel dysfunction?  denies      Patient lives alone  and has a caregiver to schedule her with ADLs 3 times a week to check on her, cleaning her house, take her to run errands .  Both daughters live in [place] .  Does the patient drive?  She no longer drives by choice   Neuropsych evaluation 08/2023. Briefly, results suggested severe impairment surrounding both delayed retrieval and recognition/consolidation aspects of memory. A further impairment was exhibited across verbal fluency (semantic worse than phonemic), while variability was exhibited across encoding (i.e., learning) aspects of memory. Regarding the underlying cause for memory impairment, current testing patterns do raise concern for underlying Alzheimer's disease. Despite  Sonya Woods demonstrating an ability to learn structured, contextualized information well initially, she was fully amnestic (i.e., 0% retention) across all three administered memory tasks and performed very poorly across yes/no recognition trials. This suggests evidence for rapid forgetting and a pronounced storage impairment, both of which are the hallmark testing patterns of this illness. Further impairment surrounding semantic fluency would follow typical disease progression. Intact confrontation naming is encouraging and could suggest this disease process being in relatively early stages. Continued medical monitoring will be important moving forward.   Initial visit 05/11/2023 How long did patient have memory difficulties?  My sister told me that I have memory issues,  but I live alone, I don't socialize often so I don't have many people I talk to.   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 Caregiver does not notice anything alarming. Recently, after her fall  after closed compression fracture of the lumbar vertebrae she reports that her family told her that her memory is worse.  At SNF they started her on Aricept  5 mg daily,  tolerating well . repeats oneself?  Denies.  Disoriented when walking into a room?  Patient denies    Leaving objects in unusual places?  denies   Wandering behavior? denies   Any personality changes ? denies   Any history of depression?: denies   Hallucinations or paranoia? denies   Seizures? denies    Any sleep changes?  Sleeps well. Denies vivid dreams, REM behavior or sleepwalking   Sleep apnea? denies   Any hygiene concerns?  denies   Independent of bathing and dressing? Endorsed  Does the patient need help with medications? Patient is in charge , she has a pill tray.  Who is in charge of the finances? Patient is in charge. I have an accounting book.      Any changes in appetite?  At SNF, they started her on mirtazapine  7.5 mg to improve  her appetite with good response.   Patient have trouble swallowing?  denies   Does the patient cook? No  Any headaches?  denies   Chronic pain? denies   Back pain well controlled at this time. No pain meds  Ambulates with difficulty? Needs a walker to ambulate  when she is not at home, for stability.   Recent falls or head injuries? While in the commode,I heard a pop and excruciating pain in the lower back, and was going to get up, so I laid on the floor, crawled and call 911. No surgery is required. The patient does not remember if she hit her head during the fall.   Vision changes? Unilateral weakness, numbness or tingling? denies   Any tremors? denies   Any anosmia? denies   Any incontinence of urine? denies   Any bowel dysfunction? denies      Patient lives alone, has a caregiver to help  her with ADLs History of heavy alcohol intake? denies   History of heavy tobacco use? denies  Quit 7 y ago.  Family history of dementia?  She does not know. She thinks that her mother may have had memory issues later in life.  Does patient drive? No . She is selling her car, I feel it is time.      Most recent CT of the head 04/12/2023  was without acute findings, mild chronic microvascular changes, normal volume for age.    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   Hypokalemia 07/24/2021   Low vitamin D  level    Measles    Mixed hyperlipidemia 08/03/2018   Pain  due to onychomycosis of toenails of both feet 01/27/2021   Prediabetes 08/03/2018     Past Surgical History:  Procedure Laterality Date   APPENDECTOMY     BIOPSY  07/26/2021   Procedure: BIOPSY;  Surgeon: Leigh Elspeth SQUIBB, MD;  Location: [facility];  Service: Gastroenterology;;   CATARACT EXTRACTION Right    CESAREAN SECTION  1976   FLEXIBLE SIGMOIDOSCOPY N/A 07/26/2021   Procedure: FLEXIBLE SIGMOIDOSCOPY;  Surgeon: Leigh Elspeth SQUIBB, MD;  Location: [facility];  Service: Gastroenterology;  Laterality: N/A;   MOHS SURGERY  06/2017   nose    S/P Bone Tumor Surgery        PREVIOUS MEDICATIONS:   CURRENT MEDICATIONS:  Outpatient Encounter Medications as of 11/11/2023  Medication Sig   B Complex-C (B-COMPLEX WITH VITAMIN C) tablet Take 1 tablet by mouth daily.   cholecalciferol (VITAMIN D ) 1000 units tablet Take 2,000 Units by mouth daily. d3   Coenzyme Q10 (CO Q 10 PO) Take 200 mg by mouth.   donepezil  (ARICEPT ) 5 MG tablet Take 1 tablet (5 mg total) by mouth at bedtime.   mirtazapine  (REMERON ) 7.5 MG tablet Take 1 tablet (7.5 mg total) by mouth at bedtime.   rosuvastatin  (CRESTOR ) 5 MG tablet Take 1 pill daily. For cholesterol.   No facility-administered encounter medications on file as of 11/11/2023.     Objective:     PHYSICAL EXAMINATION:    VITALS:   Vitals:   11/11/23 0842  BP: (!) 142/72  Pulse: 70  Resp: 18  SpO2: 99%  Weight: 145 lb (65.8 kg)  Height: 5' 7 (1.702 m)    GEN:  The patient appears stated age and is in NAD. HEENT:  Normocephalic, atraumatic.   Neurological examination:  General: NAD, well-groomed, appears stated age. Orientation: The patient is alert. Oriented to person and date, not to place. Cranial nerves: There is good facial symmetry.The speech is fluent and clear. No aphasia or dysarthria. Fund of knowledge is appropriate. Recent and remote memory impaired..  Attention and concentration are reduced.  Able to name objects and  repeat phrases.  Hearing is intact to conversational tone  Sensation: Sensation is intact to light touch throughout Motor:  Strength is at least antigravity x4. DTR's 2/4 in UE/LE      05/19/2023    8:00 AM 05/11/2023    7:00 AM  Montreal Cognitive Assessment   Visuospatial/ Executive (0/5) 1 2  Naming (0/3) 3 3  Attention: Read list of digits (0/2) 2 2  Attention: Read list of letters (0/1) 1 1  Attention: Serial 7 subtraction starting at 100 (0/3) 1 1  Language: Repeat phrase (0/2) 2 2  Language : Fluency (0/1) 0 0  Abstraction (0/2) 2 2  Delayed Recall (0/5) 1 1  Orientation (0/6) 5 5  Total 18 19  Adjusted Score (based on education) 19 20       11/04/2023   11:18 AM  MMSE - Mini Mental State Exam  Not completed: Unable to complete       Movement examination: Tone: There is normal tone in the UE/LE Abnormal movements:  no tremor.  No myoclonus.  No asterixis.   Coordination:  There is no decremation with RAM's. Normal finger to nose  Gait and Station: The patient has no difficulty arising out of a deep-seated chair without the use of the hands. The patient's stride length is good.  Gait is cautious and narrow.   Thank you for allowing us  the opportunity to participate in the care of this nice patient. Please do not hesitate to contact us  for any questions or concerns.   Total time spent on today's visit was 35 minutes dedicated to this patient today, preparing to see patient, examining the patient, ordering tests and/or medications and counseling the patient, documenting clinical information in the EHR or other health record, independently interpreting results and communicating results to the patient/family, discussing treatment and goals, answering patient's questions and coordinating care.  Cc:  Lucius Krabbe, NP  Camie Sevin 11/11/2023 12:13 PM

## 2023-11-11 NOTE — Telephone Encounter (Signed)
 Pt with Caregiver (Ms. Larey Plenty) called to get Referral sent over for MRI to be completed, please call back needs advice on how to get that started. You can also contact them at [phone].

## 2023-11-11 NOTE — Telephone Encounter (Signed)
 [facility] will start process and schedule an appt

## 2023-11-11 NOTE — Patient Instructions (Addendum)
 It was a pleasure to see you today at our office.   Recommendations:  Continue donepezil , recommend increasing 10 mg daily (in the morning). Side effects were discussed  Replenish B12       Discuss anxiety with your primary doctor. To date, yo marlou not had her MRI of the brain, that would help delineate any structural abnormalities and vascular load Follow up in 6  months   For psychiatric meds, mood meds: Please have your primary care physician manage these medications.  If you have any severe symptoms of a stroke, or other severe issues such as confusion,severe chills or fever, etc call 911 or go to the ER as you may need to be evaluated further    For assessment of decision of mental capacity and competency:  Call Dr. Rosaline Nine, geriatric psychiatrist at [phone]  Counseling regarding caregiver distress, including caregiver depression, anxiety and issues regarding community resources, adult day care programs, adult living facilities, or memory care questions:  please contact your  Primary Doctor's Social Worker   Whom to call: Memory  decline, memory medications: Call our office [phone]    [email or web address]   RECOMMENDATIONS FOR ALL PATIENTS WITH MEMORY PROBLEMS: 1. Continue to exercise (Recommend 30 minutes of walking everyday, or 3 hours every week) 2. Increase social interactions - continue going to [place] and enjoy social gatherings with friends and family 3. Eat healthy, avoid fried foods and eat more fruits and vegetables 4. Maintain adequate blood pressure, blood sugar, and blood cholesterol level. Reducing the risk of stroke and cardiovascular disease also helps promoting better memory. 5. Avoid stressful situations. Live a simple life and avoid aggravations. Organize your time and prepare for the next day in anticipation. 6. Sleep well, avoid any interruptions of sleep and avoid any distractions in the  bedroom that may interfere with adequate sleep quality 7. Avoid sugar, avoid sweets as there is a strong link between excessive sugar intake, diabetes, and cognitive impairment We discussed the Mediterranean diet, which has been shown to help patients reduce the risk of progressive memory disorders and reduces cardiovascular risk. This includes eating fish, eat fruits and green leafy vegetables, nuts like almonds and hazelnuts, walnuts, and also use olive oil. Avoid fast foods and fried foods as much as possible. Avoid sweets and sugar as sugar use has been linked to worsening of memory function.  There is always a concern of gradual progression of memory problems. If this is the case, then we may need to adjust level of care according to patient needs. Support, both to the patient and caregiver, should then be put into place.      You have been referred for a neuropsychological evaluation (i.e., evaluation of memory and thinking abilities). Please bring someone with you to this appointment if possible, as it is helpful for the doctor to hear from both you and another adult who knows you well. Please bring eyeglasses and hearing aids if you wear them.    The evaluation will take approximately 3 hours and has two parts:   The first part is a clinical interview with the neuropsychologist (Dr. Richie or Dr. Jackquline). During the interview, the neuropsychologist will speak with you and the individual you brought to the appointment.    The second part of the evaluation is testing with the doctor's technician Neal or Luke). During the testing, the technician will ask you to remember different types of material, solve problems, and answer some questionnaires. Your family member will not  be present for this portion of the evaluation.   Please note: We must reserve several hours of the neuropsychologist's time and the psychometrician's time for your evaluation appointment. As such, there is a No-Show fee of  $100. If you are unable to attend any of your appointments, please contact our office as soon as possible to reschedule.      DRIVING: Regarding driving, in patients with progressive memory problems, driving will be impaired. We advise to have someone else do the driving if trouble finding directions or if minor accidents are reported. Independent driving assessment is available to determine safety of driving.   If you are interested in the driving assessment, you can contact the following:  [facility] in [place] [phone]  [facility] [phone]  [facility] [phone]  [facility] [phone] or [phone]   FALL PRECAUTIONS: Be cautious when walking. Scan the area for obstacles that may increase the risk of trips and falls. When getting up in the mornings, sit up at the edge of the bed for a few minutes before getting out of bed. Consider elevating the bed at the head end to avoid drop of blood pressure when getting up. Walk always in a well-lit room (use night lights in the walls). Avoid area rugs or power cords from appliances in the middle of the walkways. Use a walker or a cane if necessary and consider physical therapy for balance exercise. Get your eyesight checked regularly.  FINANCIAL OVERSIGHT: Supervision, especially oversight when making financial decisions or transactions is also recommended.  HOME SAFETY: Consider the safety of the kitchen when operating appliances like stoves, microwave oven, and blender. Consider having supervision and share cooking responsibilities until no longer able to participate in those. Accidents with firearms and other hazards in the house should be identified and addressed as well.   ABILITY TO BE LEFT ALONE: If patient is unable to contact 911 operator, consider using LifeLine, or when the need is there, arrange for someone to stay with patients. Smoking is a fire hazard, consider  supervision or cessation. Risk of wandering should be assessed by caregiver and if detected at any point, supervision and safe proof recommendations should be instituted.  MEDICATION SUPERVISION: Inability to self-administer medication needs to be constantly addressed. Implement a mechanism to ensure safe administration of the medications.      Mediterranean Diet A Mediterranean diet refers to food and lifestyle choices that are based on the traditions of countries located on the [place]. This way of eating has been shown to help prevent certain conditions and improve outcomes for people who have chronic diseases, like kidney disease and heart disease. What are tips for following this plan? Lifestyle  Cook and eat meals together with your family, when possible. Drink enough fluid to keep your urine clear or pale yellow. Be physically active every day. This includes: Aerobic exercise like running or swimming. Leisure activities like gardening, walking, or housework. Get 7-8 hours of sleep each night. If recommended by your health care provider, drink red wine in moderation. This means 1 glass a day for nonpregnant women and 2 glasses a day for men. A glass of wine equals 5 oz (150 mL). Reading food labels  Check the serving size of packaged foods. For foods such as rice and pasta, the serving size refers to the amount of cooked product, not dry. Check the total fat in packaged foods. Avoid foods that have saturated fat or trans fats. Check the  ingredients list for added sugars, such as corn syrup. Shopping  At the grocery store, buy most of your food from the areas near the walls of the store. This includes: Fresh fruits and vegetables (produce). Grains, beans, nuts, and seeds. Some of these may be available in unpackaged forms or large amounts (in bulk). Fresh seafood. Poultry and eggs. Low-fat dairy products. Buy whole ingredients instead of prepackaged foods. Buy fresh  fruits and vegetables in-season from local farmers markets. Buy frozen fruits and vegetables in resealable bags. If you do not have access to quality fresh seafood, buy precooked frozen shrimp or canned fish, such as tuna, salmon, or sardines. Buy small amounts of raw or cooked vegetables, salads, or olives from the deli or salad bar at your store. Stock your pantry so you always have certain foods on hand, such as olive oil, canned tuna, canned tomatoes, rice, pasta, and beans. Cooking  Cook foods with extra-virgin olive oil instead of using butter or other vegetable oils. Have meat as a side dish, and have vegetables or grains as your main dish. This means having meat in small portions or adding small amounts of meat to foods like pasta or stew. Use beans or vegetables instead of meat in common dishes like chili or lasagna. Experiment with different cooking methods. Try roasting or broiling vegetables instead of steaming or sauteing them. Add frozen vegetables to soups, stews, pasta, or rice. Add nuts or seeds for added healthy fat at each meal. You can add these to yogurt, salads, or vegetable dishes. Marinate fish or vegetables using olive oil, lemon juice, garlic, and fresh herbs. Meal planning  Plan to eat 1 vegetarian meal one day each week. Try to work up to 2 vegetarian meals, if possible. Eat seafood 2 or more times a week. Have healthy snacks readily available, such as: Vegetable sticks with hummus. Greek yogurt. Fruit and nut trail mix. Eat balanced meals throughout the week. This includes: Fruit: 2-3 servings a day Vegetables: 4-5 servings a day Low-fat dairy: 2 servings a day Fish, poultry, or lean meat: 1 serving a day Beans and legumes: 2 or more servings a week Nuts and seeds: 1-2 servings a day Whole grains: 6-8 servings a day Extra-virgin olive oil: 3-4 servings a day Limit red meat and sweets to only a few servings a month What are my food choices? Mediterranean  diet Recommended Grains: Whole-grain pasta. Brown rice. Bulgar wheat. Polenta. Couscous. Whole-wheat bread. [place] Madeira. Vegetables: Artichokes. Beets. Broccoli. Cabbage. Carrots. Eggplant. Green beans. Chard. Kale. Spinach. Onions. Leeks. Peas. Squash. Tomatoes. Peppers. Radishes. Fruits: Apples. Apricots. Avocado. Berries. Bananas. Cherries. Dates. Figs. Grapes. Lemons. Melon. Oranges. Peaches. Plums. Pomegranate. Meats and other protein foods: Beans. Almonds. Sunflower seeds. Pine nuts. Peanuts. Cod. Salmon. Scallops. Shrimp. Tuna. Tilapia. Clams. Oysters. Eggs. Dairy: Low-fat milk. Cheese. Greek yogurt. Beverages: Water. Red wine. Herbal tea. Fats and oils: Extra virgin olive oil. Avocado oil. Grape seed oil. Sweets and desserts: Greek yogurt with honey. Baked apples. Poached pears. Trail mix. Seasoning and other foods: Basil. Cilantro. Coriander. Cumin. Mint. Parsley. Sage. Rosemary. Tarragon. Garlic. Oregano. Thyme. Pepper. Balsalmic vinegar. Tahini. Hummus. Tomato sauce. Olives. Mushrooms. Limit these Grains: Prepackaged pasta or rice dishes. Prepackaged cereal with added sugar. Vegetables: Deep fried potatoes (french fries). Fruits: Fruit canned in syrup. Meats and other protein foods: Beef. Pork. Lamb. Poultry with skin. Hot dogs. [place]. Dairy: Ice cream. Sour cream. Whole milk. Beverages: Juice. Sugar-sweetened soft drinks. Beer. Liquor and spirits. Fats and oils: Butter. Canola  oil. Vegetable oil. Beef fat (tallow). Lard. Sweets and desserts: Cookies. Cakes. Pies. Candy. Seasoning and other foods: Mayonnaise. Premade sauces and marinades. The items listed may not be a complete list. Talk with your dietitian about what dietary choices are right for you. Summary The Mediterranean diet includes both food and lifestyle choices. Eat a variety of fresh fruits and vegetables, beans, nuts, seeds, and whole grains. Limit the amount of red meat and sweets that you eat. Talk with your  health care provider about whether it is safe for you to drink red wine in moderation. This means 1 glass a day for nonpregnant women and 2 glasses a day for men. A glass of wine equals 5 oz (150 mL). This information is not intended to replace advice given to you by your health care provider. Make sure you discuss any questions you have with your health care provider. Document Released: 05/14/2016 Document Revised: 06/16/2016 Document Reviewed: 05/14/2016 Elsevier Interactive Patient Education  2017 [employer].   Labs today [address] [facility] [phone]

## 2023-12-14 ENCOUNTER — Ambulatory Visit: Payer: Medicare Other | Admitting: Family

## 2024-03-10 ENCOUNTER — Ambulatory Visit: Payer: Self-pay

## 2024-03-10 ENCOUNTER — Institutional Professional Consult (permissible substitution): Payer: Self-pay | Admitting: Psychology

## 2024-03-17 ENCOUNTER — Encounter: Payer: Self-pay | Admitting: Psychology

## 2024-03-31 ENCOUNTER — Other Ambulatory Visit: Payer: Self-pay | Admitting: Family

## 2024-03-31 DIAGNOSIS — G3184 Mild cognitive impairment, so stated: Secondary | ICD-10-CM

## 2024-03-31 DIAGNOSIS — E782 Mixed hyperlipidemia: Secondary | ICD-10-CM

## 2024-03-31 MED ORDER — DONEPEZIL HCL 5 MG PO TABS: 5.0000 mg | ORAL_TABLET | Freq: Every day | ORAL | 1 refills | Status: DC

## 2024-03-31 MED ORDER — ROSUVASTATIN CALCIUM 5 MG PO TABS: ORAL_TABLET | ORAL | 1 refills | Status: DC

## 2024-03-31 NOTE — Telephone Encounter (Signed)
 Copied from CRM [id number]. Topic: Clinical - Medication Refill >> Mar 31, 2024 10:31 AM Chiquita SQUIBB wrote: Medication:  rosuvastatin  rosuvastatin  (CRESTOR ) 5 MG tablet   donepezil  donepezil  (ARICEPT ) 5 MG tablet   Has the patient contacted their pharmacy? Yes (Agent: If no, request that the patient contact the pharmacy for the refill. If patient does not wish to contact the pharmacy document the reason why and proceed with request.) (Agent: If yes, when and what did the pharmacy advise?)  This is the patient's preferred pharmacy:  [id number] [address], [place] - [address] AT [facility] OF [facility] & [address] [place] [address] Phone: [phone] Fax: [phone]    Is this the correct pharmacy for this prescription? Yes If no, delete pharmacy and type the correct one.   Has the prescription been filled recently? No  Is the patient out of the medication? No  Has the patient been seen for an appointment in the last year OR does the patient have an upcoming appointment? Yes  Can we respond through MyChart? No  Agent: Please be advised that Rx refills may take up to 3 business days. We ask that you follow-up with your pharmacy.

## 2024-04-21 ENCOUNTER — Ambulatory Visit: Admitting: Physician Assistant

## 2024-05-10 ENCOUNTER — Ambulatory Visit: Payer: Medicare Other | Admitting: Physician Assistant

## 2024-10-16 ENCOUNTER — Other Ambulatory Visit: Payer: Self-pay | Admitting: Family

## 2024-10-16 DIAGNOSIS — E782 Mixed hyperlipidemia: Secondary | ICD-10-CM

## 2024-10-16 DIAGNOSIS — R630 Anorexia: Secondary | ICD-10-CM

## 2024-10-17 ENCOUNTER — Other Ambulatory Visit: Payer: Self-pay | Admitting: Family

## 2024-10-17 DIAGNOSIS — E782 Mixed hyperlipidemia: Secondary | ICD-10-CM

## 2024-10-18 ENCOUNTER — Other Ambulatory Visit: Payer: Self-pay | Admitting: Family

## 2024-10-18 DIAGNOSIS — G3184 Mild cognitive impairment, so stated: Secondary | ICD-10-CM

## 2024-10-18 NOTE — Telephone Encounter (Signed)
 Copied from CRM [id number]. Topic: Clinical - Medication Refill >> Oct 18, 2024 12:53 PM Montie POUR wrote: Medication:  donepezil  (ARICEPT ) 5 MG tablet -   Has the patient contacted their pharmacy? Yes (Agent: If no, request that the patient contact the pharmacy for the refill. If patient does not wish to contact the pharmacy document the reason why and proceed with request.) (Agent: If yes, when and what did the pharmacy advise?) Pharmacy needs order to refill  This is the patient's preferred pharmacy:  [id number] [address], [place] - [address] AT [facility] OF [facility] & [address] [place] [address] Phone: [phone] Fax: [phone]  Is this the correct pharmacy for this prescription? Yes If no, delete pharmacy and type the correct one.   Has the prescription been filled recently? No  Is the patient out of the medication? No  Has the patient been seen for an appointment in the last year OR does the patient have an upcoming appointment? Yes  Can we respond through MyChart? No  Agent: Please be advised that Rx refills may take up to 3 business days. We ask that you follow-up with your pharmacy.

## 2024-11-07 ENCOUNTER — Ambulatory Visit: Payer: Medicare Other

## 2024-11-28 ENCOUNTER — Ambulatory Visit
